# Patient Record
Sex: Female | Born: 1985 | Race: White | Hispanic: No | Marital: Single | Smoking: Current some day smoker
Health system: Southern US, Community
[De-identification: ages and names within clinical notes are randomized; demographics above are authoritative.]

## PROBLEM LIST (undated history)

## (undated) DIAGNOSIS — R519 Headache, unspecified: Secondary | ICD-10-CM

## (undated) DIAGNOSIS — R51 Headache: Secondary | ICD-10-CM

## (undated) DIAGNOSIS — B89 Unspecified parasitic disease: Secondary | ICD-10-CM

## (undated) DIAGNOSIS — IMO0001 Reserved for inherently not codable concepts without codable children: Secondary | ICD-10-CM

## (undated) DIAGNOSIS — F209 Schizophrenia, unspecified: Secondary | ICD-10-CM

## (undated) DIAGNOSIS — R569 Unspecified convulsions: Secondary | ICD-10-CM

## (undated) DIAGNOSIS — F32A Depression, unspecified: Secondary | ICD-10-CM

## (undated) DIAGNOSIS — F419 Anxiety disorder, unspecified: Secondary | ICD-10-CM

## (undated) DIAGNOSIS — R634 Abnormal weight loss: Secondary | ICD-10-CM

## (undated) DIAGNOSIS — R161 Splenomegaly, not elsewhere classified: Principal | ICD-10-CM

## (undated) DIAGNOSIS — D61818 Other pancytopenia: Principal | ICD-10-CM

## (undated) DIAGNOSIS — F4310 Post-traumatic stress disorder, unspecified: Secondary | ICD-10-CM

## (undated) DIAGNOSIS — D649 Anemia, unspecified: Secondary | ICD-10-CM

## (undated) DIAGNOSIS — F22 Delusional disorders: Secondary | ICD-10-CM

## (undated) DIAGNOSIS — T148XXA Other injury of unspecified body region, initial encounter: Secondary | ICD-10-CM

## (undated) DIAGNOSIS — F845 Asperger's syndrome: Secondary | ICD-10-CM

## (undated) DIAGNOSIS — R2689 Other abnormalities of gait and mobility: Secondary | ICD-10-CM

## (undated) DIAGNOSIS — F329 Major depressive disorder, single episode, unspecified: Secondary | ICD-10-CM

## (undated) HISTORY — DX: Other pancytopenia: D61.818

## (undated) HISTORY — DX: Abnormal weight loss: R63.4

## (undated) HISTORY — PX: NO PAST SURGERIES: SHX2092

## (undated) HISTORY — DX: Splenomegaly, not elsewhere classified: R16.1

## (undated) HISTORY — DX: Anemia, unspecified: D64.9

## (undated) HISTORY — PX: DILATION AND CURETTAGE OF UTERUS: SHX78

## (undated) HISTORY — DX: Other injury of unspecified body region, initial encounter: T14.8XXA

---
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
## 2010-04-19 ENCOUNTER — Emergency Department (HOSPITAL_COMMUNITY)
Admission: EM | Admit: 2010-04-19 | Discharge: 2010-04-20 | Disposition: A | Discharge status: Home / Self Care | Payer: BC Managed Care – PPO | Attending: Emergency Medicine | Admitting: Emergency Medicine

## 2010-04-19 ENCOUNTER — Emergency Department (HOSPITAL_COMMUNITY): Payer: BC Managed Care – PPO

## 2010-04-19 DIAGNOSIS — F848 Other pervasive developmental disorders: Secondary | ICD-10-CM | POA: Insufficient documentation

## 2010-04-19 DIAGNOSIS — B80 Enterobiasis: Secondary | ICD-10-CM | POA: Insufficient documentation

## 2010-04-19 DIAGNOSIS — R630 Anorexia: Secondary | ICD-10-CM | POA: Insufficient documentation

## 2010-04-19 DIAGNOSIS — K644 Residual hemorrhoidal skin tags: Secondary | ICD-10-CM | POA: Insufficient documentation

## 2010-04-19 DIAGNOSIS — K625 Hemorrhage of anus and rectum: Secondary | ICD-10-CM | POA: Insufficient documentation

## 2010-04-19 LAB — URINALYSIS, ROUTINE W REFLEX MICROSCOPIC
Bilirubin Urine: NEGATIVE
Hgb urine dipstick: NEGATIVE
Ketones, ur: NEGATIVE mg/dL
Nitrite: NEGATIVE
Protein, ur: NEGATIVE mg/dL
Specific Gravity, Urine: 1.024 (ref 1.005–1.030)
Urobilinogen, UA: 1 mg/dL (ref 0.0–1.0)

## 2010-04-19 LAB — DIFFERENTIAL
Basophils Absolute: 0 10*3/uL (ref 0.0–0.1)
Eosinophils Relative: 1 % (ref 0–5)
Lymphocytes Relative: 22 % (ref 12–46)
Lymphs Abs: 1.3 10*3/uL (ref 0.7–4.0)
Monocytes Relative: 9 % (ref 3–12)
Neutro Abs: 4.1 10*3/uL (ref 1.7–7.7)

## 2010-04-19 LAB — POCT PREGNANCY, URINE: Preg Test, Ur: NEGATIVE

## 2010-04-19 LAB — CBC
HCT: 32.1 % — ABNORMAL LOW (ref 36.0–46.0)
Hemoglobin: 9.7 g/dL — ABNORMAL LOW (ref 12.0–15.0)
MCHC: 30.2 g/dL (ref 30.0–36.0)
MCV: 73.8 fL — ABNORMAL LOW (ref 78.0–100.0)
RDW: 17.5 % — ABNORMAL HIGH (ref 11.5–15.5)
WBC: 6 10*3/uL (ref 4.0–10.5)

## 2010-04-19 LAB — COMPREHENSIVE METABOLIC PANEL
ALT: 21 U/L (ref 0–35)
Alkaline Phosphatase: 85 U/L (ref 39–117)
BUN: 12 mg/dL (ref 6–23)
CO2: 29 mEq/L (ref 19–32)
GFR calc non Af Amer: >60 mL/min (ref >60)
Glucose, Bld: 97 mg/dL (ref 70–99)
Potassium: 3.5 mEq/L (ref 3.5–5.1)
Sodium: 139 mEq/L (ref 135–145)
Total Bilirubin: 0.6 mg/dL (ref 0.3–1.2)
Total Protein: 7.5 g/dL (ref 6.0–8.3)

## 2010-04-19 LAB — LIPASE, BLOOD: Lipase: 43 U/L (ref 11–59)

## 2013-07-17 ENCOUNTER — Encounter (HOSPITAL_COMMUNITY): Payer: Self-pay | Admitting: Emergency Medicine

## 2013-07-17 ENCOUNTER — Emergency Department (HOSPITAL_COMMUNITY)
Admission: EM | Admit: 2013-07-17 | Discharge: 2013-07-17 | Disposition: A | Discharge status: Home / Self Care | Payer: Medicaid Other | Attending: Emergency Medicine | Admitting: Emergency Medicine

## 2013-07-17 DIAGNOSIS — Z8659 Personal history of other mental and behavioral disorders: Secondary | ICD-10-CM | POA: Insufficient documentation

## 2013-07-17 DIAGNOSIS — F17200 Nicotine dependence, unspecified, uncomplicated: Secondary | ICD-10-CM | POA: Insufficient documentation

## 2013-07-17 DIAGNOSIS — R791 Abnormal coagulation profile: Secondary | ICD-10-CM | POA: Insufficient documentation

## 2013-07-17 DIAGNOSIS — D696 Thrombocytopenia, unspecified: Secondary | ICD-10-CM | POA: Insufficient documentation

## 2013-07-17 DIAGNOSIS — R5381 Other malaise: Secondary | ICD-10-CM | POA: Insufficient documentation

## 2013-07-17 DIAGNOSIS — Z880 Allergy status to penicillin: Secondary | ICD-10-CM | POA: Insufficient documentation

## 2013-07-17 DIAGNOSIS — R5383 Other fatigue: Secondary | ICD-10-CM

## 2013-07-17 DIAGNOSIS — D649 Anemia, unspecified: Secondary | ICD-10-CM | POA: Insufficient documentation

## 2013-07-17 HISTORY — DX: Anxiety disorder, unspecified: F41.9

## 2013-07-17 HISTORY — DX: Asperger's syndrome: F84.5

## 2013-07-17 HISTORY — DX: Post-traumatic stress disorder, unspecified: F43.10

## 2013-07-17 LAB — IRON AND TIBC
Iron: 41 ug/dL — ABNORMAL LOW (ref 42–135)
SATURATION RATIOS: 11 % — AB (ref 20–55)
TIBC: 389 ug/dL (ref 250–470)
UIBC: 348 ug/dL (ref 125–400)

## 2013-07-17 LAB — COMPREHENSIVE METABOLIC PANEL
ALBUMIN: 5 g/dL (ref 3.5–5.2)
ALK PHOS: 58 U/L (ref 39–117)
ALT: 29 U/L (ref 0–35)
AST: 36 U/L (ref 0–37)
BILIRUBIN TOTAL: 0.4 mg/dL (ref 0.3–1.2)
BUN: 8 mg/dL (ref 6–23)
CHLORIDE: 101 meq/L (ref 96–112)
CO2: 26 mEq/L (ref 19–32)
Calcium: 10.3 mg/dL (ref 8.4–10.5)
Creatinine, Ser: 0.64 mg/dL (ref 0.50–1.10)
GFR calc Af Amer: >90 mL/min (ref >90)
GFR calc non Af Amer: >90 mL/min (ref >90)
GLUCOSE: 83 mg/dL (ref 70–99)
POTASSIUM: 4.6 meq/L (ref 3.7–5.3)
Sodium: 141 mEq/L (ref 137–147)
Total Protein: 7.8 g/dL (ref 6.0–8.3)

## 2013-07-17 LAB — CBC WITH DIFFERENTIAL/PLATELET
BASOS PCT: 1 % (ref 0–1)
Basophils Absolute: 0 10*3/uL (ref 0.0–0.1)
Eosinophils Absolute: 0 10*3/uL (ref 0.0–0.7)
Eosinophils Relative: 1 % (ref 0–5)
HCT: 31.4 % — ABNORMAL LOW (ref 36.0–46.0)
HEMOGLOBIN: 10.4 g/dL — AB (ref 12.0–15.0)
LYMPHS ABS: 1.3 10*3/uL (ref 0.7–4.0)
Lymphocytes Relative: 37 % (ref 12–46)
MCH: 29.5 pg (ref 26.0–34.0)
MCHC: 33.1 g/dL (ref 30.0–36.0)
MCV: 89.2 fL (ref 78.0–100.0)
MONOS PCT: 5 % (ref 3–12)
Monocytes Absolute: 0.2 10*3/uL (ref 0.1–1.0)
NEUTROS ABS: 2 10*3/uL (ref 1.7–7.7)
NEUTROS PCT: 57 % (ref 43–77)
Platelets: 78 10*3/uL — ABNORMAL LOW (ref 150–400)
RBC: 3.52 MIL/uL — AB (ref 3.87–5.11)
RDW: 14.3 % (ref 11.5–15.5)
WBC: 3.4 10*3/uL — ABNORMAL LOW (ref 4.0–10.5)

## 2013-07-17 LAB — PROTIME-INR
INR: 1.98 — ABNORMAL HIGH (ref 0.00–1.49)
Prothrombin Time: 21.9 seconds — ABNORMAL HIGH (ref 11.6–15.2)

## 2013-07-17 LAB — RETICULOCYTES
RBC.: 3.54 MIL/uL — AB (ref 3.87–5.11)
Retic Count, Absolute: 46 10*3/uL (ref 19.0–186.0)
Retic Ct Pct: 1.3 % (ref 0.4–3.1)

## 2013-07-17 LAB — POC OCCULT BLOOD, ED: FECAL OCCULT BLD: NEGATIVE

## 2013-07-17 LAB — FERRITIN: Ferritin: 45 ng/mL (ref 10–291)

## 2013-07-17 LAB — FOLATE

## 2013-07-17 LAB — VITAMIN B12: Vitamin B-12: 635 pg/mL (ref 211–911)

## 2013-07-17 NOTE — Discharge Instructions (Signed)
Iron Deficiency Anemia, Adult Anemia is when you have a low number of healthy red blood cells. It is often caused by too little iron. This is called iron deficiency anemia. It may make you tired and short of breath. HOME CARE   Take iron as told by your doctor.  Take vitamins as told by your doctor.  Eat foods that have iron in them. This includes liver, lean beef, whole-grain bread, eggs, dried fruit, and dark green leafy vegetables. GET HELP RIGHT AWAY IF:  You pass out (faint).  You have chest pain.  You feel sick to your stomach (nauseous) or throw up (vomit).  You get very short of breath with activity.  You are weak.  You have a fast heartbeat.  You start to sweat for no reason.  You become lightheaded when getting up from a chair or bed. MAKE SURE YOU:  Understand these instructions.  Will watch your condition.  Will get help right away if you are not doing well or get worse. Document Released: 03/23/2010 Document Revised: 12/09/2012 Document Reviewed: 10/26/2012 [facility] Patient Information 2014 [place].  Thrombocytopenia Thrombocytopenia is a condition in which there is an abnormally small number of platelets in your blood. Platelets are also called thrombocytes. Platelets are needed for blood clotting. CAUSES Thrombocytopenia is caused by:   Decreased production of platelets. This can be caused by:  Aplastic anemia in which your bone marrow quits making blood cells.  Cancer in the bone marrow.  Use of certain medicines, including chemotherapy.  Infection in the bone marrow.  Heavy alcohol consumption.  Increased destruction of platelets. This can be caused by:  Certain immune diseases.  Use of certain drugs.  Certain blood clotting disorders.  Certain inherited disorders.  Certain bleeding disorders.  Pregnancy.  Having an enlarged spleen (hypersplenism). In hypersplenism, the spleen gathers up platelets from circulation. This means  the platelets are not available to help with blood clotting. The spleen can enlarge due to cirrhosis or other conditions. SYMPTOMS  The symptoms of thrombocytopenia are side effects of poor blood clotting. Some of these are:  Abnormal bleeding.  Nosebleeds.  Heavy menstrual periods.  Blood in the urine or stools.  Purpura. This is a purplish discoloration in the skin produced by small bleeding vessels near the surface of the skin.  Bruising.  A rash that may be petechial. This looks like pinpoint, purplish-red spots on the skin and mucous membranes. It is caused by bleeding from small blood vessels (capillaries). DIAGNOSIS  Your caregiver will make this diagnosis based on your exam and blood tests. Sometimes, a bone marrow study is done to look for the original cells (megakaryocytes) that make platelets. TREATMENT  Treatment depends on the cause of the condition.  Medicines may be given to help protect your platelets from being destroyed.  In some cases, a replacement (transfusion) of platelets may be required to stop or prevent bleeding.  Sometimes, the spleen must be surgically removed. HOME CARE INSTRUCTIONS   Check the skin and linings inside your mouth for bruising or bleeding as directed by your caregiver.  Check your sputum, urine, and stool for blood as directed by your caregiver.  Do not return to any activities that could cause bumps or bruises until your caregiver says it is okay.  Take extra care not to cut yourself when shaving or when using scissors, needles, knives, and other tools.  Take extra care not to burn yourself when ironing or cooking.  Ask your caregiver if  it is okay for you to drink alcohol.  Only take over-the-counter or prescription medicines as directed by your caregiver.  Notify all your caregivers, including dentists and eye doctors, about your condition. SEEK IMMEDIATE MEDICAL CARE IF:   You develop active bleeding from anywhere in your  body.  You develop unexplained bruising or bleeding.  You have blood in your sputum, urine, or stool. MAKE SURE YOU:  Understand these instructions.  Will watch your condition.  Will get help right away if you are not doing well or get worse. Document Released: 02/18/2005 Document Revised: 05/13/2011 Document Reviewed: 12/21/2010 [facility] Patient Information 2014 [place], [place].

## 2013-07-17 NOTE — ED Provider Notes (Signed)
CSN: [id number]     Arrival date & time 07/17/13  1024 History   First MD Initiated Contact with Patient 07/17/13 1037     Chief Complaint  Patient presents with  . Weakness  . dark stools     Patient is a 28 y.o. female presenting with weakness. The history is provided by the patient.  Weakness   Pt has noticed blood in her stool for over the last month.  Over a month and a half ago she was told that her platelet count and her red blood cell count were low and a 6 month follow up was recommended.  Pt has been having increasing weakness and fatigue.  No vomiting.  She has noticed easy bruising.  She denies any trauma. She has looked paler as well.   She feels like her exercise tolerance has decreased as well. Past Medical History  Diagnosis Date  . PTSD (post-traumatic stress disorder)   . Anxiety   . Asperger's disorder    History reviewed. No pertinent past surgical history. No family history on file. History  Substance Use Topics  . Smoking status: Current Some Day Smoker    Types: Cigarettes  . Smokeless tobacco: Not on file  . Alcohol Use: No   OB History   Grav Para Term Preterm Abortions TAB SAB Ect Mult Living                 Review of Systems  Neurological: Positive for weakness.  All other systems reviewed and are negative.     Allergies  Ceclor; Peanut-containing drug products; and Penicillins  Home Medications   Prior to Admission medications   Not on File   BP 124/87  Pulse 67  Temp(Src) 97.9 F (36.6 C) (Oral)  Resp 17  SpO2 100% Physical Exam  Nursing note and vitals reviewed. Constitutional: She appears well-developed and well-nourished. No distress.  HENT:  Head: Normocephalic and atraumatic.  Right Ear: External ear normal.  Left Ear: External ear normal.  Mouth/Throat: No oropharyngeal exudate.  Eyes: Conjunctivae are normal. Right eye exhibits no discharge. Left eye exhibits no discharge. No scleral icterus.  Neck: Neck supple. No  tracheal deviation present.  Cardiovascular: Normal rate, regular rhythm and intact distal pulses.   Pulmonary/Chest: Effort normal and breath sounds normal. No stridor. No respiratory distress. She has no wheezes. She has no rales.  Abdominal: Soft. Bowel sounds are normal. She exhibits no distension. There is no tenderness. There is no rebound and no guarding.  Musculoskeletal: She exhibits no edema and no tenderness.  Neurological: She is alert. She has normal strength. No cranial nerve deficit (no facial droop, extraocular movements intact, no slurred speech) or sensory deficit. She exhibits normal muscle tone. She displays no seizure activity. Coordination normal.  Skin: Skin is warm and dry. No rash noted. She is not diaphoretic.  Multiple old bruises (brown in color) on extremities, none on torso, no petechiae or purpura  Psychiatric: She has a normal mood and affect.    ED Course  Procedures (including critical care time) Labs Review Labs Reviewed  CBC WITH DIFFERENTIAL - Abnormal; Notable for the following:    WBC 3.4 (*)    RBC 3.52 (*)    Hemoglobin 10.4 (*)    HCT 31.4 (*)    Platelets 78 (*)    All other components within normal limits  PROTIME-INR - Abnormal; Notable for the following:    Prothrombin Time 21.9 (*)    INR 1.98 (*)  All other components within normal limits  COMPREHENSIVE METABOLIC PANEL  VITAMIN S50  FOLATE  IRON AND TIBC  FERRITIN  RETICULOCYTES  POC OCCULT BLOOD, ED     MDM   Final diagnoses:  Thrombocytopenia  Anemia    Patient has no evidence of blood in her stool to suggest active bleeding.  Patient is anemic. Compared to lab results in 2012 this appears to be stable. The thrombocytopenia and leukopenia are new. Patient also has an elevated PT-INR with normal LFTs.   Patient denies taking any medications or supplements. She has not had any recent illnesses or fevers.  I doubt TTP.  ?ITP, myelodysplastic syndrome, von  willebrands  Patient is otherwise hemodynamically stable without evidence of active bleeding. I think she can safely followup with hematologist for further evaluation.  Discussed these findings with the patient and her husband. They agree and are comfortable with this plan    Kathalene Frames, MD 07/17/13 1306

## 2013-07-17 NOTE — ED Notes (Addendum)
Pt from home via [facility] c/o weakness and dark stools x 3 weeks. HX of anemia. Per husband pt has aspergers anxiety, and PTSD and he answers her questions for her. Pt has multiple bruises on arms and legs. She and her husband report that these bruises come from her carrying things and bumping into things.

## 2013-09-22 ENCOUNTER — Telehealth: Payer: Self-pay | Admitting: Hematology and Oncology

## 2013-09-22 NOTE — Telephone Encounter (Signed)
LEFT MESSAGE FOR PATIENT AND GAVE NP APPT FOR 07/29 @ 11:45 W/DR. [place].  REFERRING DR. Vista Lawman DX-

## 2013-09-29 ENCOUNTER — Encounter: Payer: Self-pay | Admitting: Hematology and Oncology

## 2013-09-29 ENCOUNTER — Ambulatory Visit: Payer: Medicaid Other

## 2013-09-29 ENCOUNTER — Ambulatory Visit (HOSPITAL_BASED_OUTPATIENT_CLINIC_OR_DEPARTMENT_OTHER): Payer: Medicaid Other | Admitting: Hematology and Oncology

## 2013-09-29 ENCOUNTER — Encounter (INDEPENDENT_AMBULATORY_CARE_PROVIDER_SITE_OTHER): Payer: Self-pay

## 2013-09-29 ENCOUNTER — Telehealth: Payer: Self-pay | Admitting: Hematology and Oncology

## 2013-09-29 ENCOUNTER — Other Ambulatory Visit: Payer: Medicaid Other

## 2013-09-29 VITALS — BP 119/89 | HR 90 | Temp 97.8°F | Resp 20 | Ht 69.0 in | Wt 121.9 lb

## 2013-09-29 DIAGNOSIS — F329 Major depressive disorder, single episode, unspecified: Secondary | ICD-10-CM

## 2013-09-29 DIAGNOSIS — F32A Depression, unspecified: Secondary | ICD-10-CM

## 2013-09-29 DIAGNOSIS — R634 Abnormal weight loss: Secondary | ICD-10-CM

## 2013-09-29 DIAGNOSIS — F4310 Post-traumatic stress disorder, unspecified: Secondary | ICD-10-CM

## 2013-09-29 DIAGNOSIS — T148XXA Other injury of unspecified body region, initial encounter: Secondary | ICD-10-CM

## 2013-09-29 DIAGNOSIS — D61818 Other pancytopenia: Secondary | ICD-10-CM

## 2013-09-29 DIAGNOSIS — F3289 Other specified depressive episodes: Secondary | ICD-10-CM

## 2013-09-29 DIAGNOSIS — F17200 Nicotine dependence, unspecified, uncomplicated: Secondary | ICD-10-CM

## 2013-09-29 DIAGNOSIS — Z720 Tobacco use: Secondary | ICD-10-CM

## 2013-09-29 HISTORY — DX: Abnormal weight loss: R63.4

## 2013-09-29 HISTORY — DX: Other pancytopenia: D61.818

## 2013-09-29 HISTORY — DX: Other injury of unspecified body region, initial encounter: T14.8XXA

## 2013-09-29 LAB — CBC & DIFF AND RETIC
BASO%: 0.2 % (ref 0.0–2.0)
Basophils Absolute: 0 10*3/uL (ref 0.0–0.1)
EOS%: 0.2 % (ref 0.0–7.0)
Eosinophils Absolute: 0 10*3/uL (ref 0.0–0.5)
HEMATOCRIT: 30.3 % — AB (ref 34.8–46.6)
HGB: 10.4 g/dL — ABNORMAL LOW (ref 11.6–15.9)
IMMATURE RETIC FRACT: 2.8 % (ref 1.60–10.00)
LYMPH#: 1.9 10*3/uL (ref 0.9–3.3)
LYMPH%: 40.4 % (ref 14.0–49.7)
MCH: 29.1 pg (ref 25.1–34.0)
MCHC: 34.3 g/dL (ref 31.5–36.0)
MCV: 84.6 fL (ref 79.5–101.0)
MONO#: 0.2 10*3/uL (ref 0.1–0.9)
MONO%: 3.8 % (ref 0.0–14.0)
NEUT#: 2.6 10*3/uL (ref 1.5–6.5)
NEUT%: 55.4 % (ref 38.4–76.8)
Platelets: 133 10*3/uL — ABNORMAL LOW (ref 145–400)
RBC: 3.58 10*6/uL — ABNORMAL LOW (ref 3.70–5.45)
RDW: 13.8 % (ref 11.2–14.5)
RETIC CT ABS: 35.8 10*3/uL (ref 33.70–90.70)
Retic %: 1 % (ref 0.70–2.10)
WBC: 4.7 10*3/uL (ref 3.9–10.3)
nRBC: 0 % (ref 0–0)

## 2013-09-29 LAB — COMPREHENSIVE METABOLIC PANEL (CC13)
ALT: 27 U/L (ref 0–55)
AST: 35 U/L — AB (ref 5–34)
Albumin: 5.2 g/dL — ABNORMAL HIGH (ref 3.5–5.0)
Alkaline Phosphatase: 52 U/L (ref 40–150)
Anion Gap: 10 mEq/L (ref 3–11)
BILIRUBIN TOTAL: 0.51 mg/dL (ref 0.20–1.20)
BUN: 13.2 mg/dL (ref 7.0–26.0)
CALCIUM: 10 mg/dL (ref 8.4–10.4)
CHLORIDE: 95 meq/L — AB (ref 98–109)
CO2: 25 mEq/L (ref 22–29)
CREATININE: 0.8 mg/dL (ref 0.6–1.1)
Glucose: 88 mg/dl (ref 70–140)
Potassium: 4 mEq/L (ref 3.5–5.1)
SODIUM: 129 meq/L — AB (ref 136–145)
TOTAL PROTEIN: 8.2 g/dL (ref 6.4–8.3)

## 2013-09-29 LAB — MORPHOLOGY: PLT EST: DECREASED

## 2013-09-29 LAB — IRON AND TIBC CHCC
%SAT: 10 % — AB (ref 21–57)
Iron: 43 ug/dL (ref 41–142)
TIBC: 426 ug/dL (ref 236–444)
UIBC: 383 ug/dL (ref 120–384)

## 2013-09-29 LAB — CHCC SMEAR

## 2013-09-29 LAB — TSH CHCC: TSH: 1.682 m[IU]/L (ref 0.308–3.960)

## 2013-09-29 LAB — FERRITIN CHCC: Ferritin: 74 ng/ml (ref 9–269)

## 2013-09-29 LAB — PROTHROMBIN TIME
INR: 1.72 — AB (ref <1.50)
Prothrombin Time: 20.2 seconds — ABNORMAL HIGH (ref 11.6–15.2)

## 2013-09-29 LAB — HOLD TUBE, BLOOD BANK

## 2013-09-29 NOTE — Assessment & Plan Note (Signed)
I spent some time counseling the patient the importance of tobacco cessation. she is currently attempting to quit on her own 

## 2013-09-29 NOTE — Telephone Encounter (Signed)
Pt confirmed labs/ov per 07/29 POF, gave pt AVS...KJ °

## 2013-09-29 NOTE — Assessment & Plan Note (Signed)
She had weight loss due to poor appetite and anorexia. I will order thyroid function tests for further evaluation.

## 2013-09-29 NOTE — Assessment & Plan Note (Signed)
Stable. She has good social support.

## 2013-09-29 NOTE — Progress Notes (Signed)
Checked in new patient with no financial issues prior to seeing the dr. She has appt card °

## 2013-09-29 NOTE — Assessment & Plan Note (Signed)
She has significant excessive bruising. I will order additional workup for bleeding disorder.

## 2013-09-29 NOTE — Assessment & Plan Note (Signed)
She is on medication for this. She denies suicidal ideation.

## 2013-09-29 NOTE — Progress Notes (Signed)
[facility] CONSULT NOTE  Patient has no care team.  CHIEF COMPLAINTS/PURPOSE OF CONSULTATION:  Abnormal blood work.  HISTORY OF PRESENTING ILLNESS:  Mary Cooley 28 y.o. female is here because of pancytopenia.  She was found to have abnormal CBC from routine blood work monitoring by her primary care provider in May of 2015. At that time, her white blood cell count was 3.4, hemoglobin 10.4, platelet count 78,000 with elevated INR. This patient had prior history of posttraumatic stress disorder, depression, and anorexia nervosa. She had weight loss recently. She complained of intermittent bleeding from her stool. She also has extensive bruising throughout. She denies recent epistaxis, hematuria, or heavy menstruation. In fact, she has not had a period for a long time. The patient denies history of liver disease, exposure to heparin, history of cardiac murmur/prior cardiovascular surgery or recent new medications She denies prior blood or platelet transfusions   MEDICAL HISTORY:  Past Medical History  Diagnosis Date  . PTSD (post-traumatic stress disorder)   . Anxiety   . Asperger's disorder   . Anemia   . Other pancytopenia 09/29/2013  . Bruising 09/29/2013  . Weight loss 09/29/2013    SURGICAL HISTORY: History reviewed. No pertinent past surgical history.  SOCIAL HISTORY: History   Social History  . Marital Status: Single    Spouse Name: N/A    Number of Children: N/A  . Years of Education: N/A   Occupational History  . Not on file.   Social History Main Topics  . Smoking status: Current Some Day Smoker -- 0.01 packs/day    Types: Cigarettes  . Smokeless tobacco: Never Used  . Alcohol Use: No  . Drug Use: No  . Sexual Activity: Not on file   Other Topics Concern  . Not on file   Social History Narrative  . No narrative on file    FAMILY HISTORY: Family History  Problem Relation Age of Onset  . Heart disease Father     ALLERGIES:  is  allergic to ceclor; peanut-containing drug products; and penicillins.  MEDICATIONS:  Current Outpatient Prescriptions  Medication Sig Dispense Refill  . clonazePAM (KLONOPIN) 0.5 MG tablet Take 0.5 mg by mouth 3 (three) times daily as needed for anxiety.      Marland Kitchen ibuprofen (ADVIL,MOTRIN) 200 MG tablet Take 200-400 mg by mouth every 6 (six) hours as needed for moderate pain.      . Multiple Vitamin (MULTIVITAMIN WITH MINERALS) TABS tablet Take 1 tablet by mouth daily.      . promethazine (PHENERGAN) 12.5 MG tablet Take 12.5-25 mg by mouth every 6 (six) hours as needed for nausea or vomiting.      . sertraline (ZOLOFT) 100 MG tablet Take 100 mg by mouth daily.       No current facility-administered medications for this visit.    REVIEW OF SYSTEMS:   Constitutional: Denies fevers, chills or abnormal night sweats Eyes: Denies blurriness of vision, double vision or watery eyes Ears, nose, mouth, throat, and face: Denies mucositis or sore throat Respiratory: Denies cough, dyspnea or wheezes Cardiovascular: Denies palpitation, chest discomfort or lower extremity swelling Gastrointestinal:  Denies nausea, heartburn or change in bowel habits Skin: Denies abnormal skin rashes Lymphatics: Denies new lymphadenopathy  Neurological:Denies numbness, tingling or new weaknesses Behavioral/Psych: Mood is stable, no new changes  All other systems were reviewed with the patient and are negative.  PHYSICAL EXAMINATION: ECOG PERFORMANCE STATUS: 0 - Asymptomatic  Filed Vitals:   09/29/13 1218  BP:  119/89  Pulse: 90  Temp: 97.8 F (36.6 C)  Resp: 20   Filed Weights   09/29/13 1218  Weight: 121 lb 14.4 oz (55.293 kg)    GENERAL:alert, no distress and comfortable. She looks thin and unkempt SKIN: Extensive bruises all over her body. No petechiae rash. Notice skin vitiligo EYES: normal, conjunctiva are pink and non-injected, sclera clear OROPHARYNX:no exudate, no erythema and lips, buccal mucosa,  and tongue normal. Poor dentition is noted NECK: supple, thyroid normal size, non-tender, without nodularity LYMPH:  no palpable lymphadenopathy in the cervical, axillary or inguinal LUNGS: clear to auscultation and percussion with normal breathing effort HEART: regular rate & rhythm and no murmurs and no lower extremity edema ABDOMEN:abdomen soft, non-tender and normal bowel sounds. Mildly enlarged spleen is palpable Musculoskeletal:no cyanosis of digits and no clubbing  PSYCH: alert & oriented x 3 with fluent speech NEURO: no focal motor/sensory deficits  LABORATORY DATA:  I have reviewed the data as listed Recent Results (from the past 2160 hour(s))  POC OCCULT BLOOD, ED     Status: None   Collection Time    07/17/13 11:03 AM      Result Value Ref Range   Fecal Occult Bld NEGATIVE  NEGATIVE  CBC WITH DIFFERENTIAL     Status: Abnormal   Collection Time    07/17/13 11:30 AM      Result Value Ref Range   WBC 3.4 (*) 4.0 - 10.5 K/uL   RBC 3.52 (*) 3.87 - 5.11 MIL/uL   Hemoglobin 10.4 (*) 12.0 - 15.0 g/dL   HCT 31.4 (*) 36.0 - 46.0 %   MCV 89.2  78.0 - 100.0 fL   MCH 29.5  26.0 - 34.0 pg   MCHC 33.1  30.0 - 36.0 g/dL   RDW 14.3  11.5 - 15.5 %   Platelets 78 (*) 150 - 400 K/uL   Comment: REPEATED TO VERIFY     SPECIMEN CHECKED FOR CLOTS     PLATELET COUNT CONFIRMED BY SMEAR   Neutrophils Relative % 57  43 - 77 %   Neutro Abs 2.0  1.7 - 7.7 K/uL   Lymphocytes Relative 37  12 - 46 %   Lymphs Abs 1.3  0.7 - 4.0 K/uL   Monocytes Relative 5  3 - 12 %   Monocytes Absolute 0.2  0.1 - 1.0 K/uL   Eosinophils Relative 1  0 - 5 %   Eosinophils Absolute 0.0  0.0 - 0.7 K/uL   Basophils Relative 1  0 - 1 %   Basophils Absolute 0.0  0.0 - 0.1 K/uL  COMPREHENSIVE METABOLIC PANEL     Status: None   Collection Time    07/17/13 11:30 AM      Result Value Ref Range   Sodium 141  137 - 147 mEq/L   Potassium 4.6  3.7 - 5.3 mEq/L   Chloride 101  96 - 112 mEq/L   CO2 26  19 - 32 mEq/L    Glucose, Bld 83  70 - 99 mg/dL   BUN 8  6 - 23 mg/dL   Creatinine, Ser 0.64  0.50 - 1.10 mg/dL   Calcium 10.3  8.4 - 10.5 mg/dL   Total Protein 7.8  6.0 - 8.3 g/dL   Albumin 5.0  3.5 - 5.2 g/dL   AST 36  0 - 37 U/L   ALT 29  0 - 35 U/L   Alkaline Phosphatase 58  39 - 117 U/L   Total  Bilirubin 0.4  0.3 - 1.2 mg/dL   GFR calc non Af Amer >90  >90 mL/min   GFR calc Af Amer >90  >90 mL/min   Comment: (NOTE)     The eGFR has been calculated using the CKD EPI equation.     This calculation has not been validated in all clinical situations.     eGFR's persistently <90 mL/min signify possible Chronic Kidney     Disease.  VITAMIN B12     Status: None   Collection Time    07/17/13 11:30 AM      Result Value Ref Range   Vitamin B-12 635  211 - 911 pg/mL   Comment: Performed at [facility]     Status: None   Collection Time    07/17/13 11:30 AM      Result Value Ref Range   Folate >20.0     Comment: (NOTE)     Reference Ranges            Deficient:       0.4 - 3.3 ng/mL            Indeterminate:   3.4 - 5.4 ng/mL            Normal:              > 5.4 ng/mL     Performed at [facility]  IRON AND TIBC     Status: Abnormal   Collection Time    07/17/13 11:30 AM      Result Value Ref Range   Iron 41 (*) 42 - 135 ug/dL   TIBC 389  250 - 470 ug/dL   Saturation Ratios 11 (*) 20 - 55 %   UIBC 348  125 - 400 ug/dL   Comment: Performed at [facility]     Status: None   Collection Time    07/17/13 11:30 AM      Result Value Ref Range   Ferritin 45  10 - 291 ng/mL   Comment: Performed at [facility]     Status: Abnormal   Collection Time    07/17/13 11:30 AM      Result Value Ref Range   Retic Ct Pct 1.3  0.4 - 3.1 %   RBC. 3.54 (*) 3.87 - 5.11 MIL/uL   Retic Count, Manual 46.0  19.0 - 186.0 K/uL  PROTIME-INR     Status: Abnormal   Collection Time    07/17/13 11:50 AM      Result Value Ref Range   Prothrombin  Time 21.9 (*) 11.6 - 15.2 seconds   INR 1.98 (*) 0.00 - 1.49  HIV ANTIBODY (ROUTINE TESTING)     Status: None   Collection Time    09/29/13  1:26 PM      Result Value Ref Range   HIV 1&2 Ab, 4th Generation NONREACTIVE  NONREACTIVE   Comment:  A NONREACTIVE HIV Ag/Ab result does not exclude HIV infection sincethe time frame for seroconversion is variable. If acute HIV infectionis suspected, a HIV-1 RNA Qualitative TMA test is recommended. HIV-1/2 Antibody Diff         Not indicated.HIV-1 RNA,      Qual TMA           Not indicated. PLEASE NOTE: This information has been disclosed to you from [place] confidentiality may be protected by state law. If your staterequires such protection, then the state law  prohibits you from [facility] further      disclosure of the information without the specific writtenconsent of the person to whom it pertains, or as otherwise permittedby law. A general authorization for the release of medical or otherinformation is NOT sufficient for this purpose. The      performance of this assay has not been clinically validated inpatients less than 54 years old.  VITAMIN B12     Status: None   Collection Time    09/29/13  1:26 PM      Result Value Ref Range   Vitamin B-12 580  211 - 911 pg/mL  SEDIMENTATION RATE     Status: None   Collection Time    09/29/13  1:26 PM      Result Value Ref Range   Sed Rate 1  0 - 22 mm/hr  HEPATITIS PANEL, ACUTE     Status: None   Collection Time    09/29/13  1:26 PM      Result Value Ref Range   Hepatitis B Surface Ag NEGATIVE  NEGATIVE   HCV Ab NEGATIVE  NEGATIVE   Hep B C IgM NON REACTIVE  NON REACTIVE   Comment: High levels of Hepatitis B Core IgM antibody are detectableduring the acute stage of Hepatitis B. This antibody is usedto differentiate current from past HBV infection.    Hep A IgM NON REACTIVE  NON REACTIVE  T4, FREE     Status: None   Collection Time    09/29/13  1:26 PM      Result Value Ref Range   Free T4 0.91   0.80 - 1.80 ng/dL  [facility] SMEAR     Status: None   Collection Time    09/29/13  1:27 PM      Result Value Ref Range   Smear Result Smear Available    MORPHOLOGY     Status: None   Collection Time    09/29/13  1:27 PM      Result Value Ref Range   Shistocytes Few  Negative   Burr Cells Few  Negative   Helmet Cell Few  Negative   White Cell Comments C/W auto diff     PLT EST Decreased  Adequate   Platelet Morphology Few Large Plts  Within Normal Limits  FERRITIN [facility]     Status: None   Collection Time    09/29/13  1:27 PM      Result Value Ref Range   Ferritin 74  9 - 269 ng/ml  TSH [facility]     Status: None   Collection Time    09/29/13  1:27 PM      Result Value Ref Range   TSH 1.682  0.308 - 3.960 m(IU)/L  HOLD TUBE, BLOOD BANK     Status: None   Collection Time    09/29/13  1:27 PM      Result Value Ref Range   Hold Tube, Blood Bank Blood Bank Order Cancelled    CBC & DIFF AND RETIC     Status: Abnormal   Collection Time    09/29/13  1:27 PM      Result Value Ref Range   WBC 4.7  3.9 - 10.3 10e3/uL   NEUT# 2.6  1.5 - 6.5 10e3/uL   HGB 10.4 (*) 11.6 - 15.9 g/dL   HCT 30.3 (*) 34.8 - 46.6 %   Platelets 133 (*) 145 - 400 10e3/uL   MCV 84.6  79.5 - 101.0 fL  MCH 29.1  25.1 - 34.0 pg   MCHC 34.3  31.5 - 36.0 g/dL   RBC 3.58 (*) 3.70 - 5.45 10e6/uL   RDW 13.8  11.2 - 14.5 %   lymph# 1.9  0.9 - 3.3 10e3/uL   MONO# 0.2  0.1 - 0.9 10e3/uL   Eosinophils Absolute 0.0  0.0 - 0.5 10e3/uL   Basophils Absolute 0.0  0.0 - 0.1 10e3/uL   NEUT% 55.4  38.4 - 76.8 %   LYMPH% 40.4  14.0 - 49.7 %   MONO% 3.8  0.0 - 14.0 %   EOS% 0.2  0.0 - 7.0 %   BASO% 0.2  0.0 - 2.0 %   nRBC 0  0 - 0 %   Retic % 1.00  0.70 - 2.10 %   Retic Ct Abs 35.80  33.70 - 90.70 10e3/uL   Immature Retic Fract 2.80  1.60 - 10.00 %  IRON AND TIBC [facility]     Status: Abnormal   Collection Time    09/29/13  1:27 PM      Result Value Ref Range   Iron 43  41 - 142 ug/dL   TIBC 426  236 - 444 ug/dL   UIBC 383  120  - 384 ug/dL   %SAT 10 (*) 21 - 57 %  COMPREHENSIVE METABOLIC PANEL (HQ19)     Status: Abnormal   Collection Time    09/29/13  1:27 PM      Result Value Ref Range   Sodium 129 (*) 136 - 145 mEq/L   Potassium 4.0  3.5 - 5.1 mEq/L   Chloride 95 (*) 98 - 109 mEq/L   CO2 25  22 - 29 mEq/L   Glucose 88  70 - 140 mg/dl   BUN 13.2  7.0 - 26.0 mg/dL   Creatinine 0.8  0.6 - 1.1 mg/dL   Total Bilirubin 0.51  0.20 - 1.20 mg/dL   Alkaline Phosphatase 52  40 - 150 U/L   AST 35 (*) 5 - 34 U/L   ALT 27  0 - 55 U/L   Total Protein 8.2  6.4 - 8.3 g/dL   Albumin 5.2 (*) 3.5 - 5.0 g/dL   Calcium 10.0  8.4 - 10.4 mg/dL   Anion Gap 10  3 - 11 mEq/L    ASSESSMENT & PLAN Other pancytopenia The cause is unknown. She could have mineral deficiency or nutritional deficiency due to the severe anorexia. Overall, she is not symptomatic. I will order additional workup for this.  Bruising She has significant excessive bruising. I will order additional workup for bleeding disorder.  Weight loss She had weight loss due to poor appetite and anorexia. I will order thyroid function tests for further evaluation.  PTSD (post-traumatic stress disorder) Stable. She has good social support.  Depression She is on medication for this. She denies suicidal ideation.  Tobacco abuse I spent some time counseling the patient the importance of tobacco cessation. she is currently attempting to quit on her own

## 2013-09-29 NOTE — Assessment & Plan Note (Signed)
The cause is unknown. She could have mineral deficiency or nutritional deficiency due to the severe anorexia. Overall, she is not symptomatic. I will order additional workup for this.

## 2013-09-30 LAB — HEPATITIS PANEL, ACUTE
HCV AB: NEGATIVE
Hep A IgM: NONREACTIVE
Hep B C IgM: NONREACTIVE
Hepatitis B Surface Ag: NEGATIVE

## 2013-09-30 LAB — FIBRINOGEN: Fibrinogen: 60 mg/dL — ABNORMAL LOW (ref 204–475)

## 2013-09-30 LAB — SEDIMENTATION RATE: SED RATE: 1 mm/h (ref 0–22)

## 2013-09-30 LAB — DIRECT ANTIGLOBULIN TEST (NOT AT ARMC)
DAT (COMPLEMENT): NEGATIVE
DAT IgG: NEGATIVE

## 2013-09-30 LAB — HIV ANTIBODY (ROUTINE TESTING W REFLEX): HIV 1&2 Ab, 4th Generation: NONREACTIVE

## 2013-09-30 LAB — APTT: APTT: 41 s — AB (ref 24–37)

## 2013-09-30 LAB — VITAMIN B12: VITAMIN B 12: 580 pg/mL (ref 211–911)

## 2013-09-30 LAB — ANA: Anti Nuclear Antibody(ANA): NEGATIVE

## 2013-09-30 LAB — T4, FREE: Free T4: 0.91 ng/dL (ref 0.80–1.80)

## 2013-10-04 ENCOUNTER — Other Ambulatory Visit: Payer: Self-pay | Admitting: Hematology and Oncology

## 2013-10-04 ENCOUNTER — Other Ambulatory Visit: Payer: Self-pay | Admitting: *Deleted

## 2013-10-04 ENCOUNTER — Telehealth: Payer: Self-pay | Admitting: Hematology and Oncology

## 2013-10-04 ENCOUNTER — Encounter: Payer: Self-pay | Admitting: Hematology and Oncology

## 2013-10-04 ENCOUNTER — Ambulatory Visit (HOSPITAL_BASED_OUTPATIENT_CLINIC_OR_DEPARTMENT_OTHER): Payer: Medicaid Other | Admitting: Hematology and Oncology

## 2013-10-04 ENCOUNTER — Ambulatory Visit: Payer: Medicaid Other

## 2013-10-04 VITALS — BP 119/70 | HR 99 | Temp 98.5°F | Resp 19 | Ht 69.0 in | Wt 121.9 lb

## 2013-10-04 DIAGNOSIS — Z720 Tobacco use: Secondary | ICD-10-CM

## 2013-10-04 DIAGNOSIS — F17200 Nicotine dependence, unspecified, uncomplicated: Secondary | ICD-10-CM

## 2013-10-04 DIAGNOSIS — D61818 Other pancytopenia: Secondary | ICD-10-CM

## 2013-10-04 DIAGNOSIS — R233 Spontaneous ecchymoses: Secondary | ICD-10-CM

## 2013-10-04 DIAGNOSIS — D689 Coagulation defect, unspecified: Secondary | ICD-10-CM

## 2013-10-04 DIAGNOSIS — T148XXA Other injury of unspecified body region, initial encounter: Secondary | ICD-10-CM

## 2013-10-04 DIAGNOSIS — D649 Anemia, unspecified: Secondary | ICD-10-CM

## 2013-10-04 LAB — APTT: APTT: 38 s — AB (ref 24–37)

## 2013-10-04 LAB — PROTHROMBIN TIME
INR: 1.59 — AB (ref <1.50)
Prothrombin Time: 19 seconds — ABNORMAL HIGH (ref 11.6–15.2)

## 2013-10-04 NOTE — Assessment & Plan Note (Signed)
Leukopenia has resolved. Anemia is stable. Her thrombocytopenia is improving. Overall, I suspect this is due to severe malnutrition. I recommend observation for now. Anemia is likely anemia chronic disease.

## 2013-10-04 NOTE — Assessment & Plan Note (Signed)
I spent some time counseling the patient the importance of tobacco cessation. she is currently attempting to quit on her own 

## 2013-10-04 NOTE — Assessment & Plan Note (Signed)
Her bruising is due to coagulopathy. I will order an additional workup for this.

## 2013-10-04 NOTE — Telephone Encounter (Signed)
gv and printed appt scehd and avs for pt for Aug... °

## 2013-10-04 NOTE — Assessment & Plan Note (Addendum)
She has profound abnormal coagulation testing. I will order an additional workup for causes of factor deficiency. Certainly, severely fibrinogen can cause this. She does not have evidence of liver disease.

## 2013-10-04 NOTE — Progress Notes (Signed)
[place] OFFICE PROGRESS NOTE  No primary provider on file. SUMMARY OF HEMATOLOGIC HISTORY:  Mary Cooley was found to have abnormal CBC from routine blood work monitoring by her primary care provider in May of 2015. At that time, her white blood cell count was 3.4, hemoglobin 10.4, platelet count 78,000 with elevated INR. This patient had prior history of posttraumatic stress disorder, depression, and anorexia nervosa. Mary Cooley had weight loss recently. Mary Cooley complained of intermittent bleeding from her stool. Mary Cooley also has extensive bruising throughout. Mary Cooley denies recent epistaxis, hematuria, or heavy menstruation. In fact, Mary Cooley has not had a period for a long time. The patient denies history of liver disease, exposure to heparin, history of cardiac murmur/prior cardiovascular surgery or recent new medications Mary Cooley denies prior blood or platelet transfusions INTERVAL HISTORY: Mary Cooley 28 y.o. female returns for further followup. Mary Cooley continues to bruise easily. Mary Cooley denies recent bleeding.  I have reviewed the past medical history, past surgical history, social history and family history with the patient and they are unchanged from previous note.  ALLERGIES:  is allergic to ceclor; peanut-containing drug products; and penicillins.  MEDICATIONS:  Current Outpatient Prescriptions  Medication Sig Dispense Refill  . clonazePAM (KLONOPIN) 0.5 MG tablet Take 0.5 mg by mouth 3 (three) times daily as needed for anxiety.      Marland Kitchen ibuprofen (ADVIL,MOTRIN) 200 MG tablet Take 200-400 mg by mouth every 6 (six) hours as needed for moderate pain.      . Multiple Vitamin (MULTIVITAMIN WITH MINERALS) TABS tablet Take 1 tablet by mouth daily.      . promethazine (PHENERGAN) 12.5 MG tablet Take 12.5-25 mg by mouth every 6 (six) hours as needed for nausea or vomiting.      . sertraline (ZOLOFT) 100 MG tablet Take 100 mg by mouth daily.       No current facility-administered medications for this visit.      REVIEW OF SYSTEMS:   All other systems were reviewed with the patient and are negative.  PHYSICAL EXAMINATION: ECOG PERFORMANCE STATUS: 0 - Asymptomatic  Filed Vitals:   10/04/13 1314  BP: 119/70  Pulse: 99  Temp: 98.5 F (36.9 C)  Resp: 19   Filed Weights   10/04/13 1314  Weight: 121 lb 14.4 oz (55.293 kg)    GENERAL:alert, no distress and comfortable. Mary Cooley looks unkempt SKIN: Extensive bruises are noted. Musculoskeletal:no cyanosis of digits and no clubbing  NEURO: alert & oriented x 3 with fluent speech, no focal motor/sensory deficits  LABORATORY DATA:  I have reviewed the data as listed No results found for this or any previous visit (from the past 48 hour(s)).  Lab Results  Component Value Date   WBC 4.7 09/29/2013   HGB 10.4* 09/29/2013   HCT 30.3* 09/29/2013   MCV 84.6 09/29/2013   PLT 133* 09/29/2013    ASSESSMENT & PLAN:  Other pancytopenia Leukopenia has resolved. Anemia is stable. Her thrombocytopenia is improving. Overall, I suspect this is due to severe malnutrition. I recommend observation for now. Anemia is likely anemia chronic disease.  Bruising Her bruising is due to coagulopathy. I will order an additional workup for this.  Coagulopathy Mary Cooley has profound abnormal coagulation testing. I will order an additional workup for causes of factor deficiency. Certainly, severely fibrinogen can cause this. Mary Cooley does not have evidence of liver disease.  Tobacco abuse I spent some time counseling the patient the importance of tobacco cessation. Mary Cooley is currently attempting to quit on her own  All questions were answered. The patient knows to call the clinic with any problems, questions or concerns. No barriers to learning was detected.  I spent 15 minutes counseling the patient face to face. The total time spent in the appointment was 20 minutes and more than 50% was on counseling.     [facility], [place], MD 10/04/2013 1:53 PM

## 2013-10-07 LAB — FACTOR 10 ASSAY: FACTOR X ACTIVITY: 110 % (ref 72–134)

## 2013-10-07 LAB — VON WILLEBRAND PANEL
Coagulation Factor VIII: 51 % — ABNORMAL LOW (ref 73–140)
Ristocetin Co-factor, Plasma: 117 % (ref 42–200)
VON WILLEBRAND ANTIGEN, PLASMA: 134 % (ref 50–217)

## 2013-10-07 LAB — FACTOR 5 ASSAY: Factor V Activity: 46 % — ABNORMAL LOW (ref 65–150)

## 2013-10-07 LAB — PTT FACTOR INHIBITOR (MIXING STUDY): PTT: 43 seconds — ABNORMAL HIGH (ref 24–37)

## 2013-10-07 LAB — FIBRINOGEN: FIBRINOGEN: 60 mg/dL — AB (ref 204–475)

## 2013-10-08 LAB — PT FACTOR INHIBITOR (MIXING STUDY): Protime: 22 seconds — ABNORMAL HIGH (ref 11.6–15.2)

## 2013-10-08 LAB — MIXING STUDY DILUTIONS, PT
PATIENT-4/1, IMMEDIATE MIX-PT: 16.9 s
Patient-1/1, Immediate Mix-PT: 14.3 seconds

## 2013-10-13 LAB — MIXING STUDY DILUTIONS, PTT
PATIENT 4/1 IMMEDIATE MIX: 36 s
Patient 1/1 Immediate Mix: 32 seconds

## 2013-10-18 ENCOUNTER — Telehealth: Payer: Self-pay | Admitting: Hematology and Oncology

## 2013-10-18 ENCOUNTER — Encounter: Payer: Self-pay | Admitting: Hematology and Oncology

## 2013-10-18 ENCOUNTER — Ambulatory Visit (HOSPITAL_BASED_OUTPATIENT_CLINIC_OR_DEPARTMENT_OTHER): Payer: Medicaid Other | Admitting: Hematology and Oncology

## 2013-10-18 VITALS — BP 123/82 | HR 78 | Temp 98.2°F | Resp 18 | Ht 69.0 in | Wt 121.7 lb

## 2013-10-18 DIAGNOSIS — D689 Coagulation defect, unspecified: Secondary | ICD-10-CM

## 2013-10-18 DIAGNOSIS — D61818 Other pancytopenia: Secondary | ICD-10-CM

## 2013-10-18 NOTE — Telephone Encounter (Signed)
Pt confirmed labs/ov per 08/17 POF, gave pt AVS.....KJ °

## 2013-10-18 NOTE — Assessment & Plan Note (Signed)
Leukopenia has resolved. Anemia is stable. Her thrombocytopenia is improving. Overall, I suspect this is due to severe malnutrition. I recommend observation for now. Anemia is likely anemia chronic disease. Overall, she is not symptomatic.

## 2013-10-18 NOTE — Progress Notes (Signed)
[place] OFFICE PROGRESS NOTE  No primary provider on file. SUMMARY OF HEMATOLOGIC HISTORY:  She was found to have abnormal CBC from routine blood work monitoring by her primary care provider in May of 2015. At that time, her white blood cell count was 3.4, hemoglobin 10.4, platelet count 78,000 with elevated INR. This patient had prior history of posttraumatic stress disorder, depression, and anorexia nervosa. She had weight loss recently. She complained of intermittent bleeding from her stool. She also has extensive bruising throughout. She denies recent epistaxis, hematuria, or heavy menstruation. In fact, she has not had a period for a long time. The patient denies history of liver disease, exposure to heparin, history of cardiac murmur/prior cardiovascular surgery or recent new medications She denies prior blood or platelet transfusions INTERVAL HISTORY: Mary Cooley 28 y.o. female returns for followup on test results. She has no new complaints.  I have reviewed the past medical history, past surgical history, social history and family history with the patient and they are unchanged from previous note.  ALLERGIES:  is allergic to ceclor; peanut-containing drug products; and penicillins.  MEDICATIONS:  Current Outpatient Prescriptions  Medication Sig Dispense Refill  . clonazePAM (KLONOPIN) 0.5 MG tablet Take 0.5 mg by mouth 3 (three) times daily as needed for anxiety.      Marland Kitchen ibuprofen (ADVIL,MOTRIN) 200 MG tablet Take 200-400 mg by mouth every 6 (six) hours as needed for moderate pain.      . Multiple Vitamin (MULTIVITAMIN WITH MINERALS) TABS tablet Take 1 tablet by mouth daily.      . promethazine (PHENERGAN) 12.5 MG tablet Take 12.5-25 mg by mouth every 6 (six) hours as needed for nausea or vomiting.      . sertraline (ZOLOFT) 100 MG tablet Take 100 mg by mouth daily.       No current facility-administered medications for this visit.     REVIEW OF SYSTEMS:    All other systems were reviewed with the patient and are negative.  PHYSICAL EXAMINATION: ECOG PERFORMANCE STATUS: 0 - Asymptomatic  Filed Vitals:   10/18/13 1313  BP: 123/82  Pulse: 78  Temp: 98.2 F (36.8 C)  Resp: 18   Filed Weights   10/18/13 1313  Weight: 121 lb 11.2 oz (55.203 kg)    GENERAL:alert, no distress and comfortable SKIN: Significant skin bruising EYES: normal, Conjunctiva are pale and non-injected, sclera clear Musculoskeletal:no cyanosis of digits and no clubbing  NEURO: alert & oriented x 3 with fluent speech, no focal motor/sensory deficits  LABORATORY DATA:  I have reviewed the data as listed No results found for this or any previous visit (from the past 48 hour(s)).  Lab Results  Component Value Date   WBC 4.7 09/29/2013   HGB 10.4* 09/29/2013   HCT 30.3* 09/29/2013   MCV 84.6 09/29/2013   PLT 133* 09/29/2013    ASSESSMENT & PLAN:  Coagulopathy She has low fibrinogen, low factor V and the low factor VIII level. I do not believe she has multiple coagulation factor defects at the same time, rather this is implying liver disease. I will CT scan of her abdomen for evaluation. Right now she has no significant bleeding issue for apart from bruising.  Other pancytopenia Leukopenia has resolved. Anemia is stable. Her thrombocytopenia is improving. Overall, I suspect this is due to severe malnutrition. I recommend observation for now. Anemia is likely anemia chronic disease. Overall, she is not symptomatic.      All questions were answered. The  patient knows to call the clinic with any problems, questions or concerns. No barriers to learning was detected.  I spent 15 minutes counseling the patient face to face. The total time spent in the appointment was 20 minutes and more than 50% was on counseling.     [facility], [place], MD 10/18/2013 8:46 PM

## 2013-10-18 NOTE — Assessment & Plan Note (Signed)
She has low fibrinogen, low factor V and the low factor VIII level. I do not believe she has multiple coagulation factor defects at the same time, rather this is implying liver disease. I will CT scan of her abdomen for evaluation. Right now she has no significant bleeding issue for apart from bruising.

## 2013-10-25 ENCOUNTER — Other Ambulatory Visit: Payer: Self-pay | Admitting: Hematology and Oncology

## 2013-10-25 ENCOUNTER — Ambulatory Visit (HOSPITAL_BASED_OUTPATIENT_CLINIC_OR_DEPARTMENT_OTHER): Payer: Medicaid Other

## 2013-10-25 ENCOUNTER — Ambulatory Visit (HOSPITAL_COMMUNITY)
Admission: RE | Admit: 2013-10-25 | Discharge: 2013-10-25 | Disposition: A | Discharge status: Home / Self Care | Payer: Medicaid Other | Source: Ambulatory Visit | Attending: Hematology and Oncology | Admitting: Hematology and Oncology

## 2013-10-25 ENCOUNTER — Encounter (HOSPITAL_COMMUNITY): Payer: Self-pay

## 2013-10-25 ENCOUNTER — Other Ambulatory Visit (HOSPITAL_COMMUNITY)
Admission: RE | Admit: 2013-10-25 | Discharge: 2013-10-25 | Disposition: A | Discharge status: Home / Self Care | Payer: Medicaid Other | Source: Ambulatory Visit | Attending: Hematology and Oncology | Admitting: Hematology and Oncology

## 2013-10-25 DIAGNOSIS — D61818 Other pancytopenia: Secondary | ICD-10-CM | POA: Diagnosis not present

## 2013-10-25 DIAGNOSIS — X58XXXA Exposure to other specified factors, initial encounter: Secondary | ICD-10-CM | POA: Diagnosis not present

## 2013-10-25 DIAGNOSIS — T148XXA Other injury of unspecified body region, initial encounter: Secondary | ICD-10-CM

## 2013-10-25 DIAGNOSIS — Y929 Unspecified place or not applicable: Secondary | ICD-10-CM | POA: Diagnosis not present

## 2013-10-25 DIAGNOSIS — R161 Splenomegaly, not elsewhere classified: Secondary | ICD-10-CM | POA: Diagnosis not present

## 2013-10-25 DIAGNOSIS — D689 Coagulation defect, unspecified: Secondary | ICD-10-CM

## 2013-10-25 LAB — COMPREHENSIVE METABOLIC PANEL (CC13)
ALBUMIN: 4.8 g/dL (ref 3.5–5.0)
ALT: 31 U/L (ref 0–55)
ANION GAP: 8 meq/L (ref 3–11)
AST: 43 U/L — ABNORMAL HIGH (ref 5–34)
Alkaline Phosphatase: 52 U/L (ref 40–150)
BUN: 7.1 mg/dL (ref 7.0–26.0)
CALCIUM: 9.7 mg/dL (ref 8.4–10.4)
CO2: 26 meq/L (ref 22–29)
Chloride: 89 mEq/L — ABNORMAL LOW (ref 98–109)
Creatinine: 0.7 mg/dL (ref 0.6–1.1)
GLUCOSE: 83 mg/dL (ref 70–140)
POTASSIUM: 4.6 meq/L (ref 3.5–5.1)
Sodium: 122 mEq/L — ABNORMAL LOW (ref 136–145)
Total Bilirubin: 0.44 mg/dL (ref 0.20–1.20)
Total Protein: 7.4 g/dL (ref 6.4–8.3)

## 2013-10-25 LAB — CBC WITH DIFFERENTIAL/PLATELET
BASO%: 0.7 % (ref 0.0–2.0)
BASOS ABS: 0 10*3/uL (ref 0.0–0.1)
EOS ABS: 0 10*3/uL (ref 0.0–0.5)
EOS%: 0.3 % (ref 0.0–7.0)
HCT: 30.9 % — ABNORMAL LOW (ref 34.8–46.6)
HEMOGLOBIN: 10 g/dL — AB (ref 11.6–15.9)
LYMPH%: 34.6 % (ref 14.0–49.7)
MCH: 28.5 pg (ref 25.1–34.0)
MCHC: 32.4 g/dL (ref 31.5–36.0)
MCV: 88.1 fL (ref 79.5–101.0)
MONO#: 0.3 10*3/uL (ref 0.1–0.9)
MONO%: 5.9 % (ref 0.0–14.0)
NEUT#: 2.9 10*3/uL (ref 1.5–6.5)
NEUT%: 58.5 % (ref 38.4–76.8)
PLATELETS: 128 10*3/uL — AB (ref 145–400)
RBC: 3.51 10*6/uL — AB (ref 3.70–5.45)
RDW: 14.2 % (ref 11.2–14.5)
WBC: 4.9 10*3/uL (ref 3.9–10.3)
lymph#: 1.7 10*3/uL (ref 0.9–3.3)

## 2013-10-25 LAB — CHCC SMEAR

## 2013-10-25 MED ORDER — IOHEXOL 300 MG/ML  SOLN
100.0000 mL | Freq: Once | INTRAMUSCULAR | Status: AC | PRN
  Administered 2013-10-25: 100 mL via INTRAVENOUS

## 2013-10-26 LAB — SEDIMENTATION RATE: SED RATE: 1 mm/h (ref 0–22)

## 2013-10-26 LAB — PROTHROMBIN TIME
INR: 1.97 — ABNORMAL HIGH (ref <1.50)
PROTHROMBIN TIME: 22.4 s — AB (ref 11.6–15.2)

## 2013-10-26 LAB — FIBRINOGEN: Fibrinogen: 60 mg/dL — ABNORMAL LOW (ref 204–475)

## 2013-10-26 LAB — APTT: aPTT: 41 seconds — ABNORMAL HIGH (ref 24–37)

## 2013-10-27 LAB — FLOW CYTOMETRY

## 2013-10-28 ENCOUNTER — Ambulatory Visit (HOSPITAL_BASED_OUTPATIENT_CLINIC_OR_DEPARTMENT_OTHER): Payer: Medicaid Other | Admitting: Hematology and Oncology

## 2013-10-28 ENCOUNTER — Telehealth: Payer: Self-pay | Admitting: Hematology and Oncology

## 2013-10-28 ENCOUNTER — Encounter: Payer: Self-pay | Admitting: Hematology and Oncology

## 2013-10-28 VITALS — BP 127/84 | HR 80 | Temp 99.6°F | Resp 18 | Ht 69.0 in | Wt 120.0 lb

## 2013-10-28 DIAGNOSIS — D61818 Other pancytopenia: Secondary | ICD-10-CM

## 2013-10-28 DIAGNOSIS — R161 Splenomegaly, not elsewhere classified: Secondary | ICD-10-CM

## 2013-10-28 DIAGNOSIS — D689 Coagulation defect, unspecified: Secondary | ICD-10-CM

## 2013-10-28 HISTORY — DX: Splenomegaly, not elsewhere classified: R16.1

## 2013-10-28 NOTE — Assessment & Plan Note (Signed)
The patient is symptomatic. The appearance on CT scan suggested possibility of hemangioma. With the size of the lesion greater than 4 cm, that his risk of transformation to angiosarcoma risk is spontaneous splenic rupture. I recommend general surgery consultation.

## 2013-10-28 NOTE — Progress Notes (Signed)
[place] OFFICE PROGRESS NOTE  No primary provider on file. SUMMARY OF HEMATOLOGIC HISTORY: She was found to have abnormal CBC from routine blood work monitoring by her primary care provider in May of 2015. At that time, her white blood cell count was 3.4, hemoglobin 10.4, platelet count 78,000 with elevated INR. This patient had prior history of posttraumatic stress disorder, depression, and anorexia nervosa. She had weight loss recently. She complained of intermittent bleeding from her stool. She also has extensive bruising throughout. She denies recent epistaxis, hematuria, or heavy menstruation. In fact, she has not had a period for a long time. The patient denies history of liver disease, exposure to heparin, history of cardiac murmur/prior cardiovascular surgery or recent new medications She denies prior blood or platelet transfusions. On 10/25/2013, CT scan of the abdomen and pelvis revealed a large splenic mass, suspicious for hemangioma. INTERVAL HISTORY: Mary Cooley 28 y.o. female returns for further followup. She has easy bruising. The patient denies any recent signs or symptoms of bleeding such as spontaneous epistaxis, hematuria or hematochezia. She has no left upper quadrant discomfort.  I have reviewed the past medical history, past surgical history, social history and family history with the patient and they are unchanged from previous note.  ALLERGIES:  is allergic to ceclor; peanut-containing drug products; and penicillins.  MEDICATIONS:  Current Outpatient Prescriptions  Medication Sig Dispense Refill  . clonazePAM (KLONOPIN) 0.5 MG tablet Take 0.5 mg by mouth 3 (three) times daily as needed for anxiety.      Marland Kitchen ibuprofen (ADVIL,MOTRIN) 200 MG tablet Take 200-400 mg by mouth every 6 (six) hours as needed for moderate pain.      . Multiple Vitamin (MULTIVITAMIN WITH MINERALS) TABS tablet Take 1 tablet by mouth daily.      . promethazine (PHENERGAN)  12.5 MG tablet Take 12.5-25 mg by mouth every 6 (six) hours as needed for nausea or vomiting.      . sertraline (ZOLOFT) 100 MG tablet Take 100 mg by mouth daily.       No current facility-administered medications for this visit.     REVIEW OF SYSTEMS:   Constitutional: Denies fevers, chills or night sweats Eyes: Denies blurriness of vision Ears, nose, mouth, throat, and face: Denies mucositis or sore throat Respiratory: Denies cough, dyspnea or wheezes Cardiovascular: Denies palpitation, chest discomfort or lower extremity swelling Gastrointestinal:  Denies nausea, heartburn or change in bowel habits Skin: Denies abnormal skin rashes Lymphatics: Denies new lymphadenopathy Neurological:Denies numbness, tingling or new weaknesses Behavioral/Psych: Mood is stable, no new changes  All other systems were reviewed with the patient and are negative.  PHYSICAL EXAMINATION: ECOG PERFORMANCE STATUS: 1 - Symptomatic but completely ambulatory  Filed Vitals:   10/28/13 1407  BP: 127/84  Pulse: 80  Temp: 99.6 F (37.6 C)  Resp: 18   Filed Weights   10/28/13 1407  Weight: 120 lb (54.432 kg)    GENERAL:alert, no distress and comfortable SKIN: Extensive skin bruises are noted. EYES: normal, Conjunctiva are pink and non-injected, sclera clear OROPHARYNX:no exudate, no erythema and lips, buccal mucosa, and tongue normal. Poor dentition is noted Musculoskeletal:no cyanosis of digits and no clubbing  NEURO: alert & oriented x 3 with fluent speech, no focal motor/sensory deficits  LABORATORY DATA:  I have reviewed the data as listed No results found for this or any previous visit (from the past 48 hour(s)).  Lab Results  Component Value Date   WBC 4.9 10/25/2013   HGB 10.0*  10/25/2013   HCT 30.9* 10/25/2013   MCV 88.1 10/25/2013   PLT 128* 10/25/2013    RADIOGRAPHIC STUDIES: I reviewed the CT scan with the patient. I have personally reviewed the radiological images as listed and agreed  with the findings in the report. ASSESSMENT & PLAN:  Splenomegaly The patient is symptomatic. The appearance on CT scan suggested possibility of hemangioma. With the size of the lesion greater than 4 cm, that his risk of transformation to angiosarcoma risk is spontaneous splenic rupture. I recommend general surgery consultation.  Coagulopathy She has coagulopathy due to low fibrinogen. The patient would require cryoprecipitate infusion prior to surgery.    All questions were answered. The patient knows to call the clinic with any problems, questions or concerns. No barriers to learning was detected.  I spent 25 minutes counseling the patient face to face. The total time spent in the appointment was 30 minutes and more than 50% was on counseling.     [facility], [place], MD 10/28/2013 7:39 PM

## 2013-10-28 NOTE — Telephone Encounter (Signed)
RE REFERRAL TO [facility] - PT HAS [place] ACCESS AND NEEDS REFERRAL FROM PCP ON CARD TO SURGERY. PT HAS SEEN PRIMARY ON CARD AND HAS INFO FOR SURGERY. PT WILL CONTACT PCP FOR REFERRAL TO [facility]. DESK NURSE INFORMED. NO F/U W/NG AT THIS TIME.

## 2013-10-28 NOTE — Assessment & Plan Note (Signed)
The pancytopenia is likely due to sequestration from significant splenomegaly. She is not symptomatic. I recommend observation only.

## 2013-10-28 NOTE — Assessment & Plan Note (Signed)
She has coagulopathy due to low fibrinogen. The patient would require cryoprecipitate infusion prior to surgery.

## 2013-11-03 ENCOUNTER — Telehealth: Payer: Self-pay | Admitting: *Deleted

## 2013-11-03 NOTE — Telephone Encounter (Signed)
Pt states she has not seen her PCP in over 1 year and she is now considered a "new patient". They are not seeing "new patients" until October. She has found out that Dr Alvy Bimler can call for an override @ [phone] option 8 to bypass PCP referral. This is in regards to PCP referral to surgeon Dr Barry Dienes for spleenectomy.

## 2013-11-12 ENCOUNTER — Telehealth: Payer: Self-pay | Admitting: *Deleted

## 2013-11-12 NOTE — Telephone Encounter (Signed)
Pt unable to get appt to see her PCP in timely manner to get referral to Surgeon.  Medicaid not allowing referral to be made by Dr. Alvy Bimler,  It has to be done by PCP.   Dr. Alvy Bimler and this RN called Medicaid attempting to get "override" so Dr. Alvy Bimler could make the surgical referral.   Medicaid did not allow this due to pt does not have appt to see Surgeon yet (and can't get one until referral is made.) Spoke w/ pt today and she states she has seen PCP, Dr. Orma Render, at [facility].  He is willing to see pt soon and he is the MD who referred pt here in the first place.  Unfortunately he is not the PCP listed on pt's Medicaid card and he needs to be the listed PCP to be allowed to make the referral.  Pt is trying to call Medicaid today to get Dr. Orma Render listed as the PCP and then she will make appt to see him.  She will let us know when this happens.  She understands importance of getting in to see Surgeon.

## 2013-12-23 ENCOUNTER — Telehealth: Payer: Self-pay | Admitting: Hematology & Oncology

## 2013-12-23 NOTE — Telephone Encounter (Signed)
Received referral Mary Cooley at referring aware Dr. Alvy Bimler is seeing pt. I gave her [phone] number

## 2014-02-16 ENCOUNTER — Other Ambulatory Visit (INDEPENDENT_AMBULATORY_CARE_PROVIDER_SITE_OTHER): Payer: Self-pay | Admitting: Surgery

## 2014-02-16 NOTE — H&P (Signed)
Mary Cooley 02/16/2014 9:21 AM Location: [facility] Patient #: [id number] DOB: [birth year] Single / Language: [name] / Race: White Female History of Present Illness Adin Hector MD; 02/16/2014 12:44 PM) Patient words: splenectomy.  The patient is a 28 year old female who presents with a splenic disorder. 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  Pleasant thin female. 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 Also mild/moderate thrombocytopenia. Has had worsening abdominal pain. Had a CAT scan which revealed 12.5 cm vascular mass on spleen suspicious for giant hemangioma. Given the large size with risk of sarcoma transformation in the fact that she has a bleeding disorder that cannot tolerate any spontaneous rupture, surgical consultation requested. Patient notes she has pain in her upper abdomen that bothers her. Usually left-sided. She is concerned about the mass and wishes to have it removed. She does have issues where she easily bruises and cots with bleeding. Has not required much in the way of blood product transfusions. She does have PTSD and other health issues. Has some irregular bowel movements with occasional constipation or diarrhea. No major rectal bleeding. Never had any abdominal surgery. Her weight has been stable. She does smoke maybe a pack or 2 a week. Due to severe poverty cannot afford cigarettes anyway. Just got on Medicaid. She comes today with her common-law husband.    CLINICAL DATA: Pancytopenia. Clotting disorder.  EXAM: CT ABDOMEN AND PELVIS WITH CONTRAST  TECHNIQUE: Multidetector CT imaging of the abdomen and pelvis was performed using the standard protocol following bolus administration of intravenous contrast.  CONTRAST: 158mL OMNIPAQUE IOHEXOL 300 MG/ML SOLN  COMPARISON:  None.  FINDINGS: There is no pleural or pericardial effusion.  No focal liver abnormality. The gallbladder appears normal. There is no biliary dilatation. Normal appearance of the pancreas. The spleen measures 19.5 cm in length. This contains a large low-attenuation mass with peripheral, nodular enhancement which measures 11.7 x 12.5 x 6.7 cm.  The adrenal glands are normal. Normal appearance of the kidneys. The urinary bladder appears normal. The uterus and the adnexal structures have a normal appearance for patient's age.  Normal caliber of the abdominal aorta. No aneurysm. There is no retroperitoneal adenopathy identified. There is no pelvic or inguinal adenopathy.  The stomach is displaced medially due to the large splenic mass. The small bowel loops have a normal course and caliber. There is no obstruction. Normal appearance of the colon.  Review of the visualized osseous structures is unremarkable.  IMPRESSION: 1. No acute findings within the abdomen or pelvis. 2. Large mass in the spleen is favored to represent a giant hemangioma.   Electronically Signed By: Kerby Moors M.D. On: 10/25/2013 16:24  Other Problems (Mary Cooley, [place]; 02/16/2014 9:21 AM) Anxiety Disorder Bladder Problems Depression Other disease, cancer, significant illness  Past Surgical History Mary Cooley, [place]; 02/16/2014 9:21 AM) No pertinent past surgical history  Diagnostic Studies History Mary Cooley, CMA; 02/16/2014 9:21 AM) Colonoscopy never Mammogram never Pap Smear 1-5 years ago  Allergies Mary Cooley, CMA; 02/16/2014 9:23 AM) Ceclor *CEPHALOSPORINS* Penicillamine *ASSORTED CLASSES*  Medication History (Mary Cooley, CMA; 02/16/2014 9:23 AM) KlonoPIN (0.5MG  Tablet, Oral) Active. Multivitamins (Oral) Active. Sertraline HCl (100MG  Tablet, Oral) Active. Advil PM (200-25MG  Capsule, Oral) Active.  Pregnancy / Birth History Mary Cooley, [place]; 02/16/2014 9:21 AM) Age  at menarche 21 years. Contraceptive History Oral contraceptives. Gravida 1 Irregular periods  Maternal age 79-25 Para 0     Review of Systems (Mary Cooley; 02/16/2014 9:21 AM) General Present- Appetite Loss, Chills, Fatigue and Weight Loss. Not Present- Fever, Night Sweats and Weight Gain. Skin Present- Non-Healing Wounds. Not Present- Change in Wart/Mole, Dryness, Hives, Jaundice, New Lesions, Rash and Ulcer. HEENT Present- Earache, Hearing Loss, Ringing in the Ears, Seasonal Allergies, Sinus Pain, Sore Throat and Wears glasses/contact lenses. Not Present- Hoarseness, Nose Bleed, Oral Ulcers, Visual Disturbances and Yellow Eyes. Respiratory Not Present- Bloody sputum, Chronic Cough, Difficulty Breathing, Snoring and Wheezing. Breast Not Present- Breast Mass, Breast Pain, Nipple Discharge and Skin Changes. Cardiovascular Not Present- Chest Pain, Difficulty Breathing Lying Down, Leg Cramps, Palpitations, Rapid Heart Rate, Shortness of Breath and Swelling of Extremities. Gastrointestinal Present- Abdominal Pain, Bloating, Bloody Stool, Change in Bowel Habits, Chronic diarrhea, Constipation, Excessive gas, Gets full quickly at meals and Nausea. Not Present- Difficulty Swallowing, Hemorrhoids, Indigestion, Rectal Pain and Vomiting. Female Genitourinary Present- Frequency, Nocturia and Urgency. Not Present- Painful Urination and Pelvic Pain. Musculoskeletal Present- Back Pain, Joint Pain and Muscle Weakness. Not Present- Joint Stiffness, Muscle Pain and Swelling of Extremities. Neurological Present- Decreased Memory, Headaches, Tingling, Trouble walking and Weakness. Not Present- Fainting, Numbness, Seizures and Tremor. Psychiatric Present- Anxiety, Depression, Fearful and Frequent crying. Not Present- Bipolar and Change in Sleep Pattern. Endocrine Present- Cold Intolerance. Not Present- Excessive Hunger, Hair Changes, Heat Intolerance, Hot flashes and New Diabetes. Hematology Present-  Easy Bruising and Excessive bleeding. Not Present- Gland problems, HIV and Persistent Infections.  Vitals (Mary Cooley CMA; 02/16/2014 9:22 AM) 02/16/2014 9:22 AM Weight: 116 lb Height: 68in Body Surface Area: 1.59 m Body Mass Index: 17.64 kg/m Temp.: 67F(Temporal)  Pulse: 76 (Regular)  BP: 122/74 (Sitting, Left Arm, Standard)     Physical Exam Adin Hector MD; 02/16/2014 10:57 AM)  General Mental Status-Alert. General Appearance-Not in acute distress, Not Sickly. Orientation-Oriented X3. Hydration-Well hydrated. Voice-Normal.  Integumentary Global Assessment Upon inspection and palpation of skin surfaces of the - Axillae: non-tender, no inflammation or ulceration, no drainage. and Distribution of scalp and body hair is normal. General Characteristics Temperature - normal warmth is noted.  Head and Neck Head-normocephalic, atraumatic with no lesions or palpable masses. Face Global Assessment - atraumatic, no absence of expression. Neck Global Assessment - no abnormal movements, no bruit auscultated on the right, no bruit auscultated on the left, no decreased range of motion, non-tender. Trachea-midline. Thyroid Gland Characteristics - non-tender.  Eye Eyeball - Left-Extraocular movements intact, No Nystagmus. Eyeball - Right-Extraocular movements intact, No Nystagmus. Cornea - Left-No Hazy. Cornea - Right-No Hazy. Sclera/Conjunctiva - Left-No scleral icterus, No Discharge. Sclera/Conjunctiva - Right-No scleral icterus, No Discharge. Pupil - Left-Direct reaction to light normal. Pupil - Right-Direct reaction to light normal.  ENMT Ears Pinna - Left - no drainage observed, no generalized tenderness observed. Right - no drainage observed, no generalized tenderness observed. Nose and Sinuses External Inspection of the Nose - no destructive lesion observed. Inspection of the nares - Left - quiet respiration. Right - quiet  respiration. Mouth and Throat Lips - Upper Lip - no fissures observed, no pallor noted. Lower Lip - no fissures observed, no pallor noted. Nasopharynx - no discharge present. Oral Cavity/Oropharynx - Tongue - no dryness observed. Oral Mucosa - no cyanosis observed. Hypopharynx - no evidence of airway distress observed.  Chest and Lung Exam Inspection Movements - Normal and Symmetrical. Accessory muscles - No use of accessory muscles in breathing. Palpation Palpation of the chest reveals -  Non-tender. Auscultation Breath sounds - Normal and Clear.  Cardiovascular Auscultation Rhythm - Regular. Murmurs & Other Heart Sounds - Auscultation of the heart reveals - No Murmurs and No Systolic Clicks.  Abdomen Inspection Inspection of the abdomen reveals - No Visible peristalsis and No Abnormal pulsations. Umbilicus - No Bleeding, No Urine drainage. Palpation/Percussion Palpation and Percussion of the abdomen reveal - Soft, Non Tender, No Rebound tenderness, No Rigidity (guarding) and No Cutaneous hyperesthesia. Note: Abdomen soft and flat. Some fullness & tenderness in the left upper quadrant consistent with splenomegaly.   Female Genitourinary Sexual Maturity Tanner 5 - Adult hair pattern. Note: No vaginal bleeding nor discharge   Peripheral Vascular Upper Extremity Inspection - Left - No Cyanotic nailbeds, Not Ischemic. Right - No Cyanotic nailbeds, Not Ischemic.  Neurologic Neurologic evaluation reveals -normal attention span and ability to concentrate, able to name objects and repeat phrases. Appropriate fund of knowledge , normal sensation and normal coordination. Mental Status Affect - not angry, not paranoid. Cranial Nerves-Normal Bilaterally. Gait-Normal.  Neuropsychiatric Mental status exam performed with findings of-able to articulate well with normal speech/language, rate, volume and coherence, thought content normal with ability to perform basic computations  and apply abstract reasoning and no evidence of hallucinations, delusions, obsessions or homicidal/suicidal ideation. Note: Mildly odd affect but pleasant.   Musculoskeletal Global Assessment Spine, Ribs and Pelvis - no instability, subluxation or laxity. Right Upper Extremity - no instability, subluxation or laxity.  Lymphatic Head & Neck  General Head & Neck Lymphatics: Bilateral - Description - No Localized lymphadenopathy. Axillary  General Axillary Region: Bilateral - Description - No Localized lymphadenopathy. Femoral & Inguinal  Generalized Femoral & Inguinal Lymphatics: Left - Description - No Localized lymphadenopathy. Right - Description - No Localized lymphadenopathy.    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 Given size risk of sarcoma. Also with mild thrombocytopenia. Given coagulopathy, cannot tolerate the episode of spontaneous bleeding. Patient wished to be aggressive and have it removed.  We'll work to set up a preoperative immunizations for meningococcus, pneumococcus, H. influenzae. Hopefully can be done through primary care physician versus Public health Department.  Current Plans Schedule for Surgery [facility] Consent - Splenectomy (Mylan Schwarz): discussed with patient and provided information. Instructions:  Consider surgery to remove here spleen which has a large cluster blood vessels (hemangioma versus sarcoma).  Discussed with her hematologist. Plan to give cryoprecipitate around the time of surgery to minimize the chance of bleeding problems given her history of low fibrinogen.  He will benefit from having immunizations to prevent infections to meningococcus, pneumococcus, Haemophilus influenza. We will work to see if this can be done through your primary care office versus Public health clinic. 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 Dr. Rozetta Nunnery mentions using cryoprecipitate. Discussed with hematologist. Would begin to have it in her system for at least an hour or so. Plan to give cryoprecipitate on arrival to the hospital and extra intraoperatively.  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 Not severely debilitating require platelet transfusion perioperatively. We'll discuss with her hematologist.

## 2014-03-10 ENCOUNTER — Other Ambulatory Visit (INDEPENDENT_AMBULATORY_CARE_PROVIDER_SITE_OTHER): Payer: Self-pay | Admitting: Internal Medicine

## 2014-03-18 ENCOUNTER — Other Ambulatory Visit (HOSPITAL_COMMUNITY): Payer: Self-pay | Admitting: *Deleted

## 2014-03-18 NOTE — Patient Instructions (Addendum)
Mary Cooley  03/18/2014   Your procedure is scheduled on: Friday January 22nd, 2016  Report to [facility] Main  Entrance and follow signs to               [place] at 930  AM.  Call this number if you have problems the morning of surgery [phone]   Remember:  Do not eat food or drink liquids :After Midnight.     Take these medicines the morning of surgery with A SIP OF WATER: Klonopin, Sertraline                               You may not have any metal on your body including hair pins and              piercings  Do not wear jewelry, make-up, lotions, powders or perfumes.             Do not wear nail polish.  Do not shave  48 hours prior to surgery.              Men may shave face and neck.   Do not bring valuables to the hospital. [place].  Contacts, dentures or bridgework may not be worn into surgery.  Leave suitcase in the car. After surgery it may be brought to your room.     Patients discharged the day of surgery will not be allowed to drive home.  Name and phone number of your driver:  Special Instructions: N/A              Please read over the following fact sheets you were given: _____________________________________________________________________             [facility] - Preparing for Surgery Before surgery, you can play an important role.  Because skin is not sterile, your skin needs to be as free of germs as possible.  You can reduce the number of germs on your skin by washing with CHG (chlorahexidine gluconate) soap before surgery.  CHG is an antiseptic cleaner which kills germs and bonds with the skin to continue killing germs even after washing. Please DO NOT use if you have an allergy to CHG or antibacterial soaps.  If your skin becomes reddened/irritated stop using the CHG and inform your nurse when you arrive at Short Stay. Do not shave (including legs and underarms)  for at least 48 hours prior to the first CHG shower.  You may shave your face/neck. Please follow these instructions carefully:  1.  Shower with CHG Soap the night before surgery and the  morning of Surgery.  2.  If you choose to wash your hair, wash your hair first as usual with your  normal  shampoo.  3.  After you shampoo, rinse your hair and body thoroughly to remove the  shampoo.                           4.  Use CHG as you would any other liquid soap.  You can apply chg directly  to the skin and wash  Gently with a scrungie or clean washcloth.  5.  Apply the CHG Soap to your body ONLY FROM THE NECK DOWN.   Do not use on face/ open                           Wound or open sores. Avoid contact with eyes, ears mouth and genitals (private parts).                       Wash face,  Genitals (private parts) with your normal soap.             6.  Wash thoroughly, paying special attention to the area where your surgery  will be performed.  7.  Thoroughly rinse your body with warm water from the neck down.  8.  DO NOT shower/wash with your normal soap after using and rinsing off  the CHG Soap.                9.  Pat yourself dry with a clean towel.            10.  Wear clean pajamas.            11.  Place clean sheets on your bed the night of your first shower and do not  sleep with pets. Day of Surgery : Do not apply any lotions/deodorants the morning of surgery.  Please wear clean clothes to the hospital/surgery center.  FAILURE TO FOLLOW THESE INSTRUCTIONS MAY RESULT IN THE CANCELLATION OF YOUR SURGERY PATIENT SIGNATURE_________________________________  NURSE SIGNATURE__________________________________  ________________________________________________________________________  WHAT IS A BLOOD TRANSFUSION? Blood Transfusion Information  A transfusion is the replacement of blood or some of its parts. Blood is made up of multiple cells which provide different  functions.  Red blood cells carry oxygen and are used for blood loss replacement.  White blood cells fight against infection.  Platelets control bleeding.  Plasma helps clot blood.  Other blood products are available for specialized needs, such as hemophilia or other clotting disorders. BEFORE THE TRANSFUSION  Who gives blood for transfusions?   Healthy volunteers who are fully evaluated to make sure their blood is safe. This is blood bank blood. Transfusion therapy is the safest it has ever been in the practice of medicine. Before blood is taken from a donor, a complete history is taken to make sure that person has no history of diseases nor engages in risky social behavior (examples are intravenous drug use or sexual activity with multiple partners). The donor's travel history is screened to minimize risk of transmitting infections, such as malaria. The donated blood is tested for signs of infectious diseases, such as HIV and hepatitis. The blood is then tested to be sure it is compatible with you in order to minimize the chance of a transfusion reaction. If you or a relative donates blood, this is often done in anticipation of surgery and is not appropriate for emergency situations. It takes many days to process the donated blood. RISKS AND COMPLICATIONS Although transfusion therapy is very safe and saves many lives, the main dangers of transfusion include:   Getting an infectious disease.  Developing a transfusion reaction. This is an allergic reaction to something in the blood you were given. Every precaution is taken to prevent this. The decision to have a blood transfusion has been considered carefully by your caregiver before blood is given. Blood is not given unless the benefits outweigh  the risks. AFTER THE TRANSFUSION  Right after receiving a blood transfusion, you will usually feel much better and more energetic. This is especially true if your red blood cells have gotten low  (anemic). The transfusion raises the level of the red blood cells which carry oxygen, and this usually causes an energy increase.  The nurse administering the transfusion will monitor you carefully for complications. HOME CARE INSTRUCTIONS  No special instructions are needed after a transfusion. You may find your energy is better. Speak with your caregiver about any limitations on activity for underlying diseases you may have. SEEK MEDICAL CARE IF:   Your condition is not improving after your transfusion.  You develop redness or irritation at the intravenous (IV) site. SEEK IMMEDIATE MEDICAL CARE IF:  Any of the following symptoms occur over the next 12 hours:  Shaking chills.  You have a temperature by mouth above 102 F (38.9 C), not controlled by medicine.  Chest, back, or muscle pain.  People around you feel you are not acting correctly or are confused.  Shortness of breath or difficulty breathing.  Dizziness and fainting.  You get a rash or develop hives.  You have a decrease in urine output.  Your urine turns a dark color or changes to pink, red, or brown. Any of the following symptoms occur over the next 10 days:  You have a temperature by mouth above 102 F (38.9 C), not controlled by medicine.  Shortness of breath.  Weakness after normal activity.  The white part of the eye turns yellow (jaundice).  You have a decrease in the amount of urine or are urinating less often.  Your urine turns a dark color or changes to pink, red, or brown. Document Released: 02/16/2000 Document Revised: 05/13/2011 Document Reviewed: 10/05/2007 [facility] Patient Information 2014 [place], [place].  _______________________________________________________________________

## 2014-03-22 ENCOUNTER — Encounter (HOSPITAL_COMMUNITY): Payer: Self-pay

## 2014-03-22 ENCOUNTER — Encounter (HOSPITAL_COMMUNITY)
Admission: RE | Admit: 2014-03-22 | Discharge: 2014-03-22 | Disposition: A | Discharge status: Home / Self Care | Payer: Medicaid Other | Source: Ambulatory Visit | Attending: Surgery | Admitting: Surgery

## 2014-03-22 DIAGNOSIS — R0602 Shortness of breath: Secondary | ICD-10-CM | POA: Diagnosis not present

## 2014-03-22 DIAGNOSIS — Z01818 Encounter for other preprocedural examination: Secondary | ICD-10-CM | POA: Diagnosis present

## 2014-03-22 HISTORY — DX: Unspecified convulsions: R56.9

## 2014-03-22 HISTORY — DX: Other injury of unspecified body region, initial encounter: T14.8XXA

## 2014-03-22 HISTORY — DX: Headache: R51

## 2014-03-22 HISTORY — DX: Reserved for inherently not codable concepts without codable children: IMO0001

## 2014-03-22 HISTORY — DX: Other abnormalities of gait and mobility: R26.89

## 2014-03-22 HISTORY — DX: Unspecified parasitic disease: B89

## 2014-03-22 HISTORY — DX: Headache, unspecified: R51.9

## 2014-03-22 HISTORY — DX: Major depressive disorder, single episode, unspecified: F32.9

## 2014-03-22 HISTORY — DX: Depression, unspecified: F32.A

## 2014-03-22 LAB — PROTIME-INR
INR: 1.51 — AB (ref 0.00–1.49)
PROTHROMBIN TIME: 18.4 s — AB (ref 11.6–15.2)

## 2014-03-22 LAB — CBC
HEMATOCRIT: 27.3 % — AB (ref 36.0–46.0)
Hemoglobin: 9.2 g/dL — ABNORMAL LOW (ref 12.0–15.0)
MCH: 30.6 pg (ref 26.0–34.0)
MCHC: 33.7 g/dL (ref 30.0–36.0)
MCV: 90.7 fL (ref 78.0–100.0)
Platelets: 125 10*3/uL — ABNORMAL LOW (ref 150–400)
RBC: 3.01 MIL/uL — ABNORMAL LOW (ref 3.87–5.11)
RDW: 14.3 % (ref 11.5–15.5)
WBC: 4.2 10*3/uL (ref 4.0–10.5)

## 2014-03-22 LAB — BASIC METABOLIC PANEL
Anion gap: 9 (ref 5–15)
BUN: 8 mg/dL (ref 6–23)
CO2: 27 mmol/L (ref 19–32)
CREATININE: 0.49 mg/dL — AB (ref 0.50–1.10)
Calcium: 10 mg/dL (ref 8.4–10.5)
Chloride: 92 mEq/L — ABNORMAL LOW (ref 96–112)
GFR calc Af Amer: >90 mL/min (ref >90)
GFR calc non Af Amer: >90 mL/min (ref >90)
GLUCOSE: 79 mg/dL (ref 70–99)
Potassium: 3.9 mmol/L (ref 3.5–5.1)
Sodium: 128 mmol/L — ABNORMAL LOW (ref 135–145)

## 2014-03-22 LAB — APTT: APTT: 39 s — AB (ref 24–37)

## 2014-03-22 LAB — HCG, SERUM, QUALITATIVE: Preg, Serum: NEGATIVE

## 2014-03-22 NOTE — Progress Notes (Signed)
Cbc and bmet reults routed to dr gross inbasket by epic

## 2014-03-22 NOTE — Progress Notes (Addendum)
Spoke with Gwenlyn Perking RN, short stay is to hang  1st bag of cryoprecipitates am of surgery and OR holding will hand and finish 2nd bag of cryoprecipitates, spoke with San Jetty RN patient need to arrive 3 hours before surgery on 03-25-2014, spoke with melinda blood bank, draw patient type and screen day of pre op due to needing cryoprecipitates.

## 2014-03-22 NOTE — Progress Notes (Signed)
Patient refused chest xray with pre op visit  due to medicaid may not cover it.patient says she is short of breath all the time due to enlarged spleen

## 2014-03-23 DIAGNOSIS — F4310 Post-traumatic stress disorder, unspecified: Secondary | ICD-10-CM | POA: Diagnosis not present

## 2014-03-23 DIAGNOSIS — F419 Anxiety disorder, unspecified: Secondary | ICD-10-CM | POA: Diagnosis not present

## 2014-03-23 DIAGNOSIS — D682 Hereditary deficiency of other clotting factors: Secondary | ICD-10-CM | POA: Diagnosis not present

## 2014-03-23 DIAGNOSIS — F845 Asperger's syndrome: Secondary | ICD-10-CM | POA: Diagnosis not present

## 2014-03-23 DIAGNOSIS — D649 Anemia, unspecified: Secondary | ICD-10-CM | POA: Diagnosis not present

## 2014-03-23 DIAGNOSIS — F329 Major depressive disorder, single episode, unspecified: Secondary | ICD-10-CM | POA: Diagnosis not present

## 2014-03-23 DIAGNOSIS — F17210 Nicotine dependence, cigarettes, uncomplicated: Secondary | ICD-10-CM | POA: Diagnosis not present

## 2014-03-23 DIAGNOSIS — D1809 Hemangioma of other sites: Secondary | ICD-10-CM | POA: Diagnosis present

## 2014-03-23 DIAGNOSIS — D696 Thrombocytopenia, unspecified: Secondary | ICD-10-CM | POA: Diagnosis not present

## 2014-03-23 LAB — ABO/RH: ABO/RH(D): A POS

## 2014-03-23 NOTE — Progress Notes (Signed)
Spoke with sylvia triage nurse dr gross, faxed last office visit note 03-11-14 dr [name] to dr gross attention alicia, made sylvia aware pt had large hard area behind left knee at pre op visit 03-22-14, sylvia will get office visit note to dr gross and make him aware of area behind left knee.

## 2014-03-30 MED ORDER — GENTAMICIN SULFATE 40 MG/ML IJ SOLN
5.0000 mg/kg | INTRAVENOUS | Status: AC
  Administered 2014-03-31: 260 mg via INTRAVENOUS
  Filled 2014-03-30: qty 6.5

## 2014-03-31 ENCOUNTER — Inpatient Hospital Stay (HOSPITAL_COMMUNITY): Payer: Medicaid Other | Admitting: Anesthesiology

## 2014-03-31 ENCOUNTER — Observation Stay (HOSPITAL_COMMUNITY)
Admission: RE | Admit: 2014-03-31 | Discharge: 2014-04-02 | Disposition: A | Discharge status: Home / Self Care | Payer: Medicaid Other | Source: Ambulatory Visit | Attending: Surgery | Admitting: Surgery

## 2014-03-31 ENCOUNTER — Encounter (HOSPITAL_COMMUNITY)
Admission: RE | Disposition: A | Discharge status: Home / Self Care | Payer: Self-pay | Source: Ambulatory Visit | Attending: Surgery

## 2014-03-31 ENCOUNTER — Encounter (HOSPITAL_COMMUNITY): Payer: Self-pay | Admitting: *Deleted

## 2014-03-31 DIAGNOSIS — F419 Anxiety disorder, unspecified: Secondary | ICD-10-CM | POA: Insufficient documentation

## 2014-03-31 DIAGNOSIS — F4310 Post-traumatic stress disorder, unspecified: Secondary | ICD-10-CM | POA: Diagnosis not present

## 2014-03-31 DIAGNOSIS — T148XXA Other injury of unspecified body region, initial encounter: Secondary | ICD-10-CM

## 2014-03-31 DIAGNOSIS — F17210 Nicotine dependence, cigarettes, uncomplicated: Secondary | ICD-10-CM | POA: Insufficient documentation

## 2014-03-31 DIAGNOSIS — D1809 Hemangioma of other sites: Principal | ICD-10-CM | POA: Insufficient documentation

## 2014-03-31 DIAGNOSIS — D696 Thrombocytopenia, unspecified: Secondary | ICD-10-CM | POA: Insufficient documentation

## 2014-03-31 DIAGNOSIS — D682 Hereditary deficiency of other clotting factors: Secondary | ICD-10-CM | POA: Insufficient documentation

## 2014-03-31 DIAGNOSIS — D689 Coagulation defect, unspecified: Secondary | ICD-10-CM | POA: Diagnosis present

## 2014-03-31 DIAGNOSIS — F845 Asperger's syndrome: Secondary | ICD-10-CM | POA: Insufficient documentation

## 2014-03-31 DIAGNOSIS — D61818 Other pancytopenia: Secondary | ICD-10-CM

## 2014-03-31 DIAGNOSIS — F329 Major depressive disorder, single episode, unspecified: Secondary | ICD-10-CM | POA: Insufficient documentation

## 2014-03-31 DIAGNOSIS — R161 Splenomegaly, not elsewhere classified: Secondary | ICD-10-CM | POA: Diagnosis present

## 2014-03-31 DIAGNOSIS — D649 Anemia, unspecified: Secondary | ICD-10-CM | POA: Insufficient documentation

## 2014-03-31 HISTORY — PX: LAPAROSCOPIC SPLENECTOMY: SHX409

## 2014-03-31 LAB — PREPARE RBC (CROSSMATCH)

## 2014-03-31 LAB — PROTIME-INR
INR: 1.89 — AB (ref 0.00–1.49)
PROTHROMBIN TIME: 21.9 s — AB (ref 11.6–15.2)

## 2014-03-31 SURGERY — SPLENECTOMY, LAPAROSCOPIC
Anesthesia: General | Site: Abdomen

## 2014-03-31 MED ORDER — LIDOCAINE HCL (CARDIAC) 20 MG/ML IV SOLN
INTRAVENOUS | Status: DC | PRN
  Administered 2014-03-31: 50 mg via INTRAVENOUS

## 2014-03-31 MED ORDER — CISATRACURIUM BESYLATE 20 MG/10ML IV SOLN
INTRAVENOUS | Status: AC
  Filled 2014-03-31: qty 10

## 2014-03-31 MED ORDER — CISATRACURIUM BESYLATE (PF) 10 MG/5ML IV SOLN
INTRAVENOUS | Status: DC | PRN
  Administered 2014-03-31: 6 mg via INTRAVENOUS
  Administered 2014-03-31: 2 mg via INTRAVENOUS
  Administered 2014-03-31: 4 mg via INTRAVENOUS

## 2014-03-31 MED ORDER — ONDANSETRON HCL 4 MG/2ML IJ SOLN
INTRAMUSCULAR | Status: DC | PRN
  Administered 2014-03-31: 4 mg via INTRAVENOUS

## 2014-03-31 MED ORDER — CLONAZEPAM 0.5 MG PO TABS
0.5000 mg | ORAL_TABLET | Freq: Three times a day (TID) | ORAL | Status: DC | PRN
  Administered 2014-04-01: 0.5 mg via ORAL
  Filled 2014-03-31: qty 1

## 2014-03-31 MED ORDER — BISACODYL 10 MG RE SUPP: 10.0000 mg | Freq: Two times a day (BID) | RECTAL | Status: DC | PRN

## 2014-03-31 MED ORDER — PROPOFOL 10 MG/ML IV BOLUS
INTRAVENOUS | Status: AC
  Filled 2014-03-31: qty 20

## 2014-03-31 MED ORDER — DEXAMETHASONE SODIUM PHOSPHATE 10 MG/ML IJ SOLN
INTRAMUSCULAR | Status: DC | PRN
  Administered 2014-03-31: 10 mg via INTRAVENOUS

## 2014-03-31 MED ORDER — ONDANSETRON HCL 4 MG/2ML IJ SOLN: 4.0000 mg | Freq: Four times a day (QID) | INTRAMUSCULAR | Status: DC | PRN

## 2014-03-31 MED ORDER — ONDANSETRON 4 MG PO TBDP: 4.0000 mg | ORAL_TABLET | Freq: Four times a day (QID) | ORAL | Status: DC | PRN

## 2014-03-31 MED ORDER — ONDANSETRON HCL 4 MG PO TABS: 4.0000 mg | ORAL_TABLET | Freq: Four times a day (QID) | ORAL | Status: DC | PRN

## 2014-03-31 MED ORDER — PROPOFOL 10 MG/ML IV BOLUS
INTRAVENOUS | Status: DC | PRN
  Administered 2014-03-31: 100 mg via INTRAVENOUS

## 2014-03-31 MED ORDER — SODIUM CHLORIDE 0.9 % IV SOLN: Freq: Once | INTRAVENOUS | Status: DC

## 2014-03-31 MED ORDER — POLYETHYLENE GLYCOL 3350 17 G PO PACK: 17.0000 g | PACK | Freq: Two times a day (BID) | ORAL | Status: DC | PRN

## 2014-03-31 MED ORDER — LACTATED RINGERS IR SOLN
Status: DC | PRN
  Administered 2014-03-31: 1000 mL

## 2014-03-31 MED ORDER — LACTATED RINGERS IV SOLN
INTRAVENOUS | Status: DC
  Administered 2014-03-31 – 2014-04-01 (×2): via INTRAVENOUS

## 2014-03-31 MED ORDER — NEOSTIGMINE METHYLSULFATE 10 MG/10ML IV SOLN
INTRAVENOUS | Status: DC | PRN
  Administered 2014-03-31: 3 mg via INTRAVENOUS

## 2014-03-31 MED ORDER — ALUM & MAG HYDROXIDE-SIMETH 200-200-20 MG/5ML PO SUSP
30.0000 mL | Freq: Four times a day (QID) | ORAL | Status: DC | PRN
  Administered 2014-04-01 (×2): 30 mL via ORAL
  Filled 2014-03-31 (×2): qty 30

## 2014-03-31 MED ORDER — CLINDAMYCIN PHOSPHATE 600 MG/50ML IV SOLN
600.0000 mg | INTRAVENOUS | Status: AC
  Administered 2014-03-31: 600 mg via INTRAVENOUS
  Filled 2014-03-31: qty 50

## 2014-03-31 MED ORDER — PHENOL 1.4 % MT LIQD
2.0000 | OROMUCOSAL | Status: DC | PRN
  Administered 2014-04-01: 2 via OROMUCOSAL
  Filled 2014-03-31: qty 177

## 2014-03-31 MED ORDER — OXYCODONE HCL 5 MG PO TABS
5.0000 mg | ORAL_TABLET | ORAL | Status: DC | PRN
  Administered 2014-04-01: 5 mg via ORAL
  Filled 2014-03-31: qty 1

## 2014-03-31 MED ORDER — LACTATED RINGERS IV SOLN
INTRAVENOUS | Status: DC
  Administered 2014-03-31: 17:00:00 via INTRAVENOUS
  Administered 2014-03-31: 1000 mL via INTRAVENOUS

## 2014-03-31 MED ORDER — ONDANSETRON 8 MG/NS 50 ML IVPB
8.0000 mg | Freq: Four times a day (QID) | INTRAVENOUS | Status: DC | PRN
Start: 2014-03-31 — End: 2014-04-02
  Filled 2014-03-31: qty 8

## 2014-03-31 MED ORDER — OXYCODONE HCL 5 MG PO TABS: 5.0000 mg | ORAL_TABLET | Freq: Once | ORAL | Status: DC | PRN

## 2014-03-31 MED ORDER — MAGIC MOUTHWASH
15.0000 mL | Freq: Four times a day (QID) | ORAL | Status: DC | PRN
  Filled 2014-03-31: qty 15

## 2014-03-31 MED ORDER — BUPIVACAINE-EPINEPHRINE 0.25% -1:200000 IJ SOLN
INTRAMUSCULAR | Status: DC | PRN
  Administered 2014-03-31: 50 mL

## 2014-03-31 MED ORDER — 0.9 % SODIUM CHLORIDE (POUR BTL) OPTIME
TOPICAL | Status: DC | PRN
  Administered 2014-03-31: 2000 mL

## 2014-03-31 MED ORDER — BUPIVACAINE-EPINEPHRINE 0.25% -1:200000 IJ SOLN
INTRAMUSCULAR | Status: AC
  Filled 2014-03-31: qty 1

## 2014-03-31 MED ORDER — SODIUM CHLORIDE 0.9 % IJ SOLN
INTRAMUSCULAR | Status: AC
  Filled 2014-03-31: qty 10

## 2014-03-31 MED ORDER — SERTRALINE HCL 100 MG PO TABS
100.0000 mg | ORAL_TABLET | Freq: Every morning | ORAL | Status: DC
  Administered 2014-04-01: 100 mg via ORAL
  Filled 2014-03-31 (×2): qty 1

## 2014-03-31 MED ORDER — ONDANSETRON HCL 4 MG/2ML IJ SOLN
INTRAMUSCULAR | Status: AC
  Filled 2014-03-31: qty 2

## 2014-03-31 MED ORDER — PNEUMOCOCCAL VAC POLYVALENT 25 MCG/0.5ML IJ INJ: 0.5000 mL | INJECTION | INTRAMUSCULAR | Status: DC | PRN

## 2014-03-31 MED ORDER — SUFENTANIL CITRATE 50 MCG/ML IV SOLN
INTRAVENOUS | Status: DC | PRN
  Administered 2014-03-31 (×3): 10 ug via INTRAVENOUS

## 2014-03-31 MED ORDER — ADULT MULTIVITAMIN W/MINERALS CH
1.0000 | ORAL_TABLET | Freq: Every day | ORAL | Status: DC
  Administered 2014-03-31 – 2014-04-01 (×2): 1 via ORAL
  Filled 2014-03-31 (×3): qty 1

## 2014-03-31 MED ORDER — MIDAZOLAM HCL 2 MG/2ML IJ SOLN
INTRAMUSCULAR | Status: AC
  Filled 2014-03-31: qty 2

## 2014-03-31 MED ORDER — ACETAMINOPHEN 500 MG PO TABS
1000.0000 mg | ORAL_TABLET | Freq: Three times a day (TID) | ORAL | Status: DC
  Administered 2014-04-01 – 2014-04-02 (×3): 1000 mg via ORAL
  Filled 2014-03-31 (×7): qty 2

## 2014-03-31 MED ORDER — HYDROMORPHONE HCL 1 MG/ML IJ SOLN
0.5000 mg | INTRAMUSCULAR | Status: DC | PRN
  Administered 2014-03-31: 1 mg via INTRAVENOUS
  Administered 2014-04-01: 0.5 mg via INTRAVENOUS
  Administered 2014-04-01: 1 mg via INTRAVENOUS
  Administered 2014-04-01 (×2): 0.5 mg via INTRAVENOUS
  Administered 2014-04-02: 1 mg via INTRAVENOUS
  Filled 2014-03-31 (×6): qty 1

## 2014-03-31 MED ORDER — OXYCODONE HCL 5 MG/5ML PO SOLN: 5.0000 mg | Freq: Once | ORAL | Status: DC | PRN

## 2014-03-31 MED ORDER — EPHEDRINE SULFATE 50 MG/ML IJ SOLN
INTRAMUSCULAR | Status: DC | PRN
  Administered 2014-03-31 (×2): 10 mg via INTRAVENOUS

## 2014-03-31 MED ORDER — SUCCINYLCHOLINE CHLORIDE 20 MG/ML IJ SOLN
INTRAMUSCULAR | Status: DC | PRN
  Administered 2014-03-31: 50 mg via INTRAVENOUS

## 2014-03-31 MED ORDER — SODIUM CHLORIDE 0.9 % IV SOLN
INTRAVENOUS | Status: DC
  Administered 2014-03-31: 15:00:00 via INTRAVENOUS
  Administered 2014-03-31: 500 mL via INTRAVENOUS

## 2014-03-31 MED ORDER — GLYCOPYRROLATE 0.2 MG/ML IJ SOLN
INTRAMUSCULAR | Status: DC | PRN
  Administered 2014-03-31: 0.4 mg via INTRAVENOUS

## 2014-03-31 MED ORDER — ACETAMINOPHEN 10 MG/ML IV SOLN
1000.0000 mg | Freq: Once | INTRAVENOUS | Status: AC
  Administered 2014-03-31: 1000 mg via INTRAVENOUS
  Filled 2014-03-31: qty 100

## 2014-03-31 MED ORDER — EPHEDRINE SULFATE 50 MG/ML IJ SOLN
INTRAMUSCULAR | Status: AC
  Filled 2014-03-31: qty 1

## 2014-03-31 MED ORDER — LACTATED RINGERS IV BOLUS (SEPSIS): 1000.0000 mL | Freq: Three times a day (TID) | INTRAVENOUS | Status: DC | PRN

## 2014-03-31 MED ORDER — MIDAZOLAM HCL 5 MG/5ML IJ SOLN
INTRAMUSCULAR | Status: DC | PRN
  Administered 2014-03-31: 2 mg via INTRAVENOUS

## 2014-03-31 MED ORDER — LIDOCAINE HCL (CARDIAC) 20 MG/ML IV SOLN
INTRAVENOUS | Status: AC
  Filled 2014-03-31: qty 5

## 2014-03-31 MED ORDER — HAEMOPHILUS B POLYSAC CONJ VAC IM SOLR
0.5000 mL | Freq: Once | INTRAMUSCULAR | Status: AC
  Administered 2014-03-31: 0.5 mL via INTRAMUSCULAR
  Filled 2014-03-31: qty 0.5

## 2014-03-31 MED ORDER — SUFENTANIL CITRATE 50 MCG/ML IV SOLN
INTRAVENOUS | Status: AC
  Filled 2014-03-31: qty 1

## 2014-03-31 MED ORDER — GLYCOPYRROLATE 0.2 MG/ML IJ SOLN
INTRAMUSCULAR | Status: AC
  Filled 2014-03-31: qty 2

## 2014-03-31 MED ORDER — PROMETHAZINE HCL 12.5 MG PO TABS
12.5000 mg | ORAL_TABLET | Freq: Four times a day (QID) | ORAL | Status: DC | PRN
  Filled 2014-03-31: qty 2

## 2014-03-31 MED ORDER — MENTHOL 3 MG MT LOZG
1.0000 | LOZENGE | OROMUCOSAL | Status: DC | PRN
  Administered 2014-03-31: 3 mg via ORAL
  Filled 2014-03-31: qty 9

## 2014-03-31 MED ORDER — HYDROMORPHONE HCL 1 MG/ML IJ SOLN: 0.2500 mg | INTRAMUSCULAR | Status: DC | PRN

## 2014-03-31 MED ORDER — MENINGOCOCCAL VAC A,C,Y,W-135 ~~LOC~~ INJ: 0.5000 mL | INJECTION | SUBCUTANEOUS | Status: DC | PRN

## 2014-03-31 MED ORDER — DIPHENHYDRAMINE HCL 50 MG/ML IJ SOLN: 12.5000 mg | Freq: Four times a day (QID) | INTRAMUSCULAR | Status: DC | PRN

## 2014-03-31 MED ORDER — PROMETHAZINE HCL 25 MG/ML IJ SOLN: 6.2500 mg | INTRAMUSCULAR | Status: DC | PRN

## 2014-03-31 MED ORDER — OXYCODONE HCL 5 MG PO TABS: 5.0000 mg | ORAL_TABLET | ORAL | Status: DC | PRN

## 2014-03-31 MED ORDER — LIP MEDEX EX OINT
1.0000 "application " | TOPICAL_OINTMENT | Freq: Two times a day (BID) | CUTANEOUS | Status: DC
  Administered 2014-03-31 – 2014-04-02 (×4): 1 via TOPICAL
  Filled 2014-03-31 (×2): qty 7

## 2014-03-31 MED ORDER — METOCLOPRAMIDE HCL 5 MG/ML IJ SOLN: 5.0000 mg | Freq: Four times a day (QID) | INTRAMUSCULAR | Status: DC | PRN

## 2014-03-31 MED ORDER — DEXAMETHASONE SODIUM PHOSPHATE 10 MG/ML IJ SOLN
INTRAMUSCULAR | Status: AC
  Filled 2014-03-31: qty 1

## 2014-03-31 MED ORDER — DIPHENHYDRAMINE HCL 25 MG PO CAPS: 25.0000 mg | ORAL_CAPSULE | Freq: Four times a day (QID) | ORAL | Status: DC | PRN

## 2014-03-31 SURGICAL SUPPLY — 62 items
ANCHOR TIS RET SYS 200 (MISCELLANEOUS) IMPLANT
APPLIER CLIP 5 13 M/L LIGAMAX5 (MISCELLANEOUS) ×2
APPLIER CLIP ROT 10 11.4 M/L (STAPLE)
APPLIER CLIP ROT 13.4 12 LRG (CLIP)
CHLORAPREP W/TINT 26ML (MISCELLANEOUS) ×2 IMPLANT
CLAMP ENDO BABCK 10MM (STAPLE) IMPLANT
CLIP APPLIE 5 13 M/L LIGAMAX5 (MISCELLANEOUS) ×1 IMPLANT
CLIP APPLIE ROT 10 11.4 M/L (STAPLE) IMPLANT
CLIP APPLIE ROT 13.4 12 LRG (CLIP) IMPLANT
CONNECTOR 5 IN 1 STRAIGHT STRL (MISCELLANEOUS) ×2 IMPLANT
DECANTER SPIKE VIAL GLASS SM (MISCELLANEOUS) ×2 IMPLANT
DISSECTOR BLUNT TIP ENDO 5MM (MISCELLANEOUS) IMPLANT
DRAPE LAPAROSCOPIC ABDOMINAL (DRAPES) ×2 IMPLANT
DRAPE WARM FLUID 44X44 (DRAPE) ×2 IMPLANT
DRSG OPSITE POSTOP 4X6 (GAUZE/BANDAGES/DRESSINGS) ×2 IMPLANT
DRSG TEGADERM 4X4.75 (GAUZE/BANDAGES/DRESSINGS) ×2 IMPLANT
DRSG TEGADERM 8X12 (GAUZE/BANDAGES/DRESSINGS) ×6 IMPLANT
GAUZE SPONGE 2X2 8PLY STRL LF (GAUZE/BANDAGES/DRESSINGS) ×1 IMPLANT
GLOVE BIO SURGEON STRL SZ 6.5 (GLOVE) ×4 IMPLANT
GLOVE BIOGEL PI IND STRL 6 (GLOVE) ×1 IMPLANT
GLOVE BIOGEL PI IND STRL 6.5 (GLOVE) ×2 IMPLANT
GLOVE BIOGEL PI IND STRL 7.0 (GLOVE) ×4 IMPLANT
GLOVE BIOGEL PI INDICATOR 6 (GLOVE) ×1
GLOVE BIOGEL PI INDICATOR 6.5 (GLOVE) ×2
GLOVE BIOGEL PI INDICATOR 7.0 (GLOVE) ×4
GLOVE ECLIPSE 8.0 STRL XLNG CF (GLOVE) ×2 IMPLANT
GLOVE INDICATOR 8.0 STRL GRN (GLOVE) ×4 IMPLANT
GLOVE SURG SS PI 6.5 STRL IVOR (GLOVE) ×2 IMPLANT
GOWN STRL REUS W/TWL LRG LVL3 (GOWN DISPOSABLE) ×4 IMPLANT
GOWN STRL REUS W/TWL XL LVL3 (GOWN DISPOSABLE) ×4 IMPLANT
HANDLE STAPLE EGIA 4 XL (STAPLE) IMPLANT
IV LACTATED RINGERS 1000ML (IV SOLUTION) ×2 IMPLANT
KIT BASIN OR (CUSTOM PROCEDURE TRAY) ×2 IMPLANT
NS IRRIG 1000ML POUR BTL (IV SOLUTION) ×4 IMPLANT
PENCIL BUTTON HOLSTER BLD 10FT (ELECTRODE) ×2 IMPLANT
POUCH ENDO CATCH II 15MM (MISCELLANEOUS) IMPLANT
POUCH SPECIMEN RETRIEVAL 10MM (ENDOMECHANICALS) IMPLANT
SCISSORS LAP 5X35 DISP (ENDOMECHANICALS) ×2 IMPLANT
SET IRRIG TUBING LAPAROSCOPIC (IRRIGATION / IRRIGATOR) ×2 IMPLANT
SHEARS HARMONIC ACE PLUS 36CM (ENDOMECHANICALS) ×2 IMPLANT
SLEEVE XCEL OPT CAN 5 100 (ENDOMECHANICALS) ×6 IMPLANT
SOLUTION ANTI FOG 6CC (MISCELLANEOUS) IMPLANT
SPONGE GAUZE 2X2 STER 10/PKG (GAUZE/BANDAGES/DRESSINGS) ×1
SPONGE LAP 18X18 X RAY DECT (DISPOSABLE) IMPLANT
STAPLER VISISTAT 35W (STAPLE) ×2 IMPLANT
SUT MNCRL AB 4-0 PS2 18 (SUTURE) ×4 IMPLANT
SUT PDS AB 0 CT1 36 (SUTURE) ×4 IMPLANT
SUT SILK 2 0 (SUTURE) ×1
SUT SILK 2-0 30XBRD TIE 12 (SUTURE) ×1 IMPLANT
TAPE CLOTH 4X10 WHT NS (GAUZE/BANDAGES/DRESSINGS) ×2 IMPLANT
TOWEL OR 17X26 10 PK STRL BLUE (TOWEL DISPOSABLE) ×2 IMPLANT
TRAY FOLEY CATH 14FRSI W/METER (CATHETERS) ×2 IMPLANT
TRAY LAPAROSCOPIC (CUSTOM PROCEDURE TRAY) ×2 IMPLANT
TROCAR BLADELESS OPT 5 75 (ENDOMECHANICALS) ×2 IMPLANT
TROCAR XCEL 12X100 BLDLESS (ENDOMECHANICALS) ×2 IMPLANT
TROCAR XCEL BLUNT TIP 100MML (ENDOMECHANICALS) IMPLANT
TROCAR XCEL NON-BLD 11X100MML (ENDOMECHANICALS) IMPLANT
TROCAR XCEL UNIV SLVE 11M 100M (ENDOMECHANICALS) IMPLANT
TUBING CONNECTING 10 (TUBING) ×2 IMPLANT
TUBING FILTER THERMOFLATOR (ELECTROSURGICAL) ×2 IMPLANT
WATER STERILE IRR 1500ML POUR (IV SOLUTION) ×2 IMPLANT
YANKAUER SUCT BULB TIP 10FT TU (MISCELLANEOUS) ×2 IMPLANT
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
## 2014-03-31 NOTE — Anesthesia Procedure Notes (Signed)
Procedure Name: Intubation Date/Time: 03/31/2014 3:30 PM Performed by: Danley Danker L Patient Re-evaluated:Patient Re-evaluated prior to inductionOxygen Delivery Method: Circle system utilized Preoxygenation: Pre-oxygenation with 100% oxygen Intubation Type: IV induction Ventilation: Mask ventilation without difficulty and Oral airway inserted - appropriate to patient size Laryngoscope Size: Sabra Heck and 2 Grade View: Grade I Tube type: Oral Tube size: 7.0 mm Number of attempts: 1 Airway Equipment and Method: Stylet Placement Confirmation: ETT inserted through vocal cords under direct vision,  breath sounds checked- equal and bilateral and positive ETCO2 Secured at: 20 cm Tube secured with: Tape Dental Injury: Teeth and Oropharynx as per pre-operative assessment

## 2014-03-31 NOTE — Anesthesia Preprocedure Evaluation (Addendum)
Anesthesia Evaluation  Patient identified by MRN, date of birth, ID band Patient awake    Reviewed: Allergy & Precautions, NPO status , Patient's Chart, lab work & pertinent test results  Airway Mallampati: III  TM Distance: >3 FB Neck ROM: Full    Dental  (+) Teeth Intact   Pulmonary Current Smoker,          Cardiovascular negative cardio ROS      Neuro/Psych  Headaches, Seizures -,  Anxiety Depression    GI/Hepatic Neg liver ROS, Splenic hemangioma   Endo/Other  negative endocrine ROS  Renal/GU negative Renal ROS     Musculoskeletal   Abdominal   Peds  Hematology  (+) anemia , Thrombocytopenia. Coagulopathy- INR 1.89. Low Fibrinogen   Anesthesia Other Findings   Reproductive/Obstetrics                            Anesthesia Physical Anesthesia Plan  ASA: III  Anesthesia Plan: General   Post-op Pain Management:    Induction: Intravenous  Airway Management Planned: Oral ETT  Additional Equipment:   Intra-op Plan:   Post-operative Plan: Extubation in OR  Informed Consent: I have reviewed the patients History and Physical, chart, labs and discussed the procedure including the risks, benefits and alternatives for the proposed anesthesia with the patient or authorized representative who has indicated his/her understanding and acceptance.   Dental advisory given  Plan Discussed with: CRNA and Surgeon  Anesthesia Plan Comments:        Anesthesia Quick Evaluation

## 2014-03-31 NOTE — Transfer of Care (Signed)
Immediate Anesthesia Transfer of Care Note  Patient: Mary Cooley  Procedure(s) Performed: Procedure(s): LAPAROSCOPIC SPLENECTOMY (N/A)  Patient Location: PACU  Anesthesia Type:General  Level of Consciousness: awake and oriented  Airway & Oxygen Therapy: Patient Spontanous Breathing and Patient connected to face mask oxygen  Post-op Assessment: Report given to RN and Post -op Vital signs reviewed and stable  Post vital signs: Reviewed and stable  Last Vitals:  Filed Vitals:   03/31/14 1440  BP: 124/74  Pulse: 80  Temp: 37.2 C  Resp: 18    Complications: No apparent anesthesia complications

## 2014-03-31 NOTE — Anesthesia Postprocedure Evaluation (Signed)
  Anesthesia Post-op Note  Patient: Mary Cooley  Procedure(s) Performed: Procedure(s): LAPAROSCOPIC SPLENECTOMY (N/A)  Patient Location: PACU  Anesthesia Type:General  Level of Consciousness: awake and alert   Airway and Oxygen Therapy: Patient Spontanous Breathing  Post-op Pain: none  Post-op Assessment: Post-op Vital signs reviewed  Post-op Vital Signs: Reviewed  Last Vitals:  Filed Vitals:   03/31/14 1917  BP:   Pulse: 78  Temp: 36.5 C  Resp: 20    Complications: No apparent anesthesia complications

## 2014-03-31 NOTE — Discharge Instructions (Signed)
LAPAROSCOPIC SURGERY: POST OP INSTRUCTIONS ° °1. DIET: Follow a light bland diet the first 24 hours after arrival home, such as soup, liquids, crackers, etc.  Be sure to include lots of fluids daily.  Avoid fast food or heavy meals as your are more likely to get nauseated.  Eat a low fat the next few days after surgery.   °2. Take your usually prescribed home medications unless otherwise directed. °3. PAIN CONTROL: °a. Pain is best controlled by a usual combination of three different methods TOGETHER: °i. Ice/Heat °ii. Over the counter pain medication °iii. Prescription pain medication °b. Most patients will experience some swelling and bruising around the incisions.  Ice packs or heating pads (30-60 minutes up to 6 times a day) will help. Use ice for the first few days to help decrease swelling and bruising, then switch to heat to help relax tight/sore spots and speed recovery.  Some people prefer to use ice alone, heat alone, alternating between ice & heat.  Experiment to what works for you.  Swelling and bruising can take several weeks to resolve.   °c. It is helpful to take an over-the-counter pain medication regularly for the first few weeks.  Choose one of the following that works best for you: °i. Naproxen (Aleve, etc)  Two 220mg tabs twice a day °ii. Ibuprofen (Advil, etc) Three 200mg tabs four times a day (every meal & bedtime) °iii. Acetaminophen (Tylenol, etc) 500-650mg four times a day (every meal & bedtime) °d. A  prescription for pain medication (such as oxycodone, hydrocodone, etc) should be given to you upon discharge.  Take your pain medication as prescribed.  °i. If you are having problems/concerns with the prescription medicine (does not control pain, nausea, vomiting, rash, itching, etc), please call us [phone] to see if we need to switch you to a different pain medicine that will work better for you and/or control your side effect better. °ii. If you need a refill on your pain medication,  please contact your pharmacy.  They will contact our office to request authorization. Prescriptions will not be filled after 5 pm or on week-ends. °4. Avoid getting constipated.  Between the surgery and the pain medications, it is common to experience some constipation.  Increasing fluid intake and taking a fiber supplement (such as Metamucil, Citrucel, FiberCon, MiraLax, etc) 1-2 times a day regularly will usually help prevent this problem from occurring.  A mild laxative (prune juice, Milk of Magnesia, MiraLax, etc) should be taken according to package directions if there are no bowel movements after 48 hours.   °5. Watch out for diarrhea.  If you have many loose bowel movements, simplify your diet to bland foods & liquids for a few days.  Stop any stool softeners and decrease your fiber supplement.  Switching to mild anti-diarrheal medications (Kayopectate, Pepto Bismol) can help.  If this worsens or does not improve, please call us. °6. Wash / shower every day.  You may shower over the dressings as they are waterproof.  Continue to shower over incision(s) after the dressing is off. °7. Remove your waterproof bandages 5 days after surgery.  You may leave the incision open to air.  You may replace a dressing/Band-Aid to cover the incision for comfort if you wish.  °8. ACTIVITIES as tolerated:   °a. You may resume regular (light) daily activities beginning the next day--such as daily self-care, walking, climbing stairs--gradually increasing activities as tolerated.  If you can walk 30 minutes without difficulty, it   is safe to try more intense activity such as jogging, treadmill, bicycling, low-impact aerobics, swimming, etc. b. Save the most intensive and strenuous activity for last such as sit-ups, heavy lifting, contact sports, etc  Refrain from any heavy lifting or straining until you are off narcotics for pain control.   c. DO NOT PUSH THROUGH PAIN.  Let pain be your guide: If it hurts to do something, don't  do it.  Pain is your body warning you to avoid that activity for another week until the pain goes down. d. You may drive when you are no longer taking prescription pain medication, you can comfortably wear a seatbelt, and you can safely maneuver your car and apply brakes. e. Dennis Bast may have sexual intercourse when it is comfortable.  9. FOLLOW UP in our office a. Please call [facility] at [phone] to set up an appointment to see your surgeon in the office for a follow-up appointment approximately 2-3 weeks after your surgery. b. Make sure that you call for this appointment the day you arrive home to insure a convenient appointment time. 10. IF YOU HAVE DISABILITY OR FAMILY LEAVE FORMS, BRING THEM TO THE OFFICE FOR PROCESSING.  DO NOT GIVE THEM TO YOUR DOCTOR.   WHEN TO CALL us [phone]: 1. Poor pain control 2. Reactions / problems with new medications (rash/itching, nausea, etc)  3. Fever over 101.5 F (38.5 C) 4. Inability to urinate 5. Nausea and/or vomiting 6. Worsening swelling or bruising 7. Continued bleeding from incision. 8. Increased pain, redness, or drainage from the incision   The clinic staff is available to answer your questions during regular business hours (8:30am-5pm).  Please dont hesitate to call and ask to speak to one of our nurses for clinical concerns.   If you have a medical emergency, go to the nearest emergency room or call 911.  A surgeon from [name] is always on call at the [facility], [place], [place], [place], [place]  [address] ? MAIN: [phone] ? TOLL FREE: [phone] ?  FAX [phone] [email or web address]  Managing Pain  Pain after surgery or related to activity is often due to strain/injury to muscle, tendon, nerves and/or incisions.  This pain is usually short-term and will improve in a few months.   Many people find it helpful to do the following things TOGETHER  to help speed the process of healing and to get back to regular activity more quickly:  1. Avoid heavy physical activity a.  no lifting greater than 20 pounds b. Do not push through the pain.  Listen to your body and avoid positions and maneuvers than reproduce the pain c. Walking is okay as tolerated, but go slowly and stop when getting sore.  d. Remember: If it hurts to do it, then dont do it! 2. Take Anti-inflammatory medication  a. Take with food/snack around the clock for 1-2 weeks i. This helps the muscle and nerve tissues become less irritable and calm down faster ii. Choose Acetaminophen 572m tabs (Tylenol) 1-2 pills with every meal and just before bedtime 3. Use a Heating pad or Ice/Cold Pack a. 4-6 times a day b. May use warm bath/hottub  or showers 4. Try Gentle Massage and/or Stretching  a. at the area of pain many times a day b. stop if you feel pain - do not overdo it  Try these steps together to help you body heal faster and avoid making things  get worse.  Doing just one of these things may not be enough.    If you are not getting better after two weeks or are noticing you are getting worse, contact our office for further advice; we may need to re-evaluate you & see what other things we can do to help.

## 2014-03-31 NOTE — Op Note (Signed)
03/31/2014  5:35 PM  PATIENT:  Mary Cooley  29 y.o. 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 Also mild/moderate thrombocytopenia. Has had worsening abdominal pain. Had a CAT scan which revealed 18 cm vascular mass on spleen suspicious for giant hemangioma. Given the large size with risk of sarcoma transformation in the fact that she has a bleeding disorder that cannot tolerate any spontaneous rupture, surgical consultation requested.   The anatomy and physiology of the spleen was discussed.  Pathophysiology of the disease was discussed.  Options were discussed, and I made a recommendation to remove the spleen to help treat the pathology.  Minimally invasive & open techniques discussed.  Risks of bleeding, infection, injury to other organs, reoperation, death, and other  risks were discussed.   I noted a good likelihood this will help address the problem.  While there are risks, I feel the risks of nonoperative management are greater; therefore, I feel surgery offers the best option. Educational material was available.  We will work to minimize complications.  Patient was supposed to get immunizations done preoperatively but due to financial and transportation issues, not able to do it.  OR FINDINGS: Left upper quadrant replace with large spleen.  2 dominant splenic arteries and 1 dominant splenic vein.  No evidence of lymphadenopathy.  DESCRIPTION:   Informed consent was confirmed.  Patient received 1 unit of cryoprecipitate just prior to surgery.  She received the second unit of cryoprecipitate during the surgery.  She underwent anesthesia without any difficulty.  She was positioned partial right-side-down decubitus with beanbag to a 45 angle.  Axillary roll placed.  Left upper extremity held over left side in natural positioning.  Chest and abdomen prepped and draped in a sterile fashion.  Surgical timeout confirmed our plan.  I placed a 5 mm port through an infraumbilical curvilinear incision using a modified Hasson cutdown technique.  We induced carbon dioxide insufflation.  Entry was clean.  There were no adhesions.  She had an obvious enlarged spleen with intrasplenic superiomedial mass noted.  I placed 5 ports in the right side and right upper quadrant.  One was placed a left lower quadrant.  I freed omental adhesions off the inferior tip of the spleen to allow the colon to fall down.  It was already rather redundant and falling down.  I was able to find the short gastrics along the greater curvature of the stomach which had been pushed midline.  Carefully freed those off using a slow ultrasonic dissection to good result.  Came up over the left crus to the apex of the spleen and the mass.  I then began to roll the inferior half of the spleen off the  retroperitoneum and freed off the left kidney and adrenal gland.  I could see and isolate the inferior splenic artery.  I placed 3 clips proximally on it and a clip on the specimen side.  Transected with 2 firings of Harmonic in between.  I then placed a GelPort through an upper midline incision given the giant size of the mass and need for removing it intact and not morcellating it.  With that I can more safely roll the spleen over and mobilize it in a lateral to medial fashion going along the left lateral retroperitoneum of the posterior diaphragm until I came over to the left crus.  No anterior or superior diaphragmatic adhesions of major significance.  With that I could roll the spleen over to the right upper quadrant.  I could isolate the remaining 2 splenic arteries.  Those were narrow enough to be taken with clips proximally and distally and with Harmonic in between.  She had a single dominant splenic vein.  This was able to be controlled with 4 clips proximally 2 clips distally and again Harmonic in between.  That allowed complete mobilization of the spleen.  We did irrigation made sure there was good hemostasis.  I ended up having to extend the midline incision to 15 cm to completely extract the mass intact.  Was about size of a truncated football.  Did careful meticulous inspection of the retroperitoneum of the left upper quadrant.  I isolated & clipped a few side branch retroperitoneal veins.  The splenic arterial & venous pedicles were further clamped and ligated with 0 silk suture to good result.    We did copious irrigation and bilateral upper quadrants and on pelvis. Had good hemostasis.  Carefully inspected make sure hemostasis was good.  Ports were removed.  I closed the midline incision using 0 PDS running suture.  Port sites and midline skin closed with 4 Monocryl suture.  Sterile dressings applied.  Patient being x-ray to go to recovery room.  I am about to try and locate her common-law  husband and family.  She will stay at least overnight, possibly longer given her coagulation disorder.  Adin Hector, M.D., F.A.C.S. Gastrointestinal and Minimally Invasive Surgery [facility] [address] [place], [place] [address] [phone] Main / Paging

## 2014-04-01 ENCOUNTER — Telehealth: Payer: Self-pay | Admitting: Hematology and Oncology

## 2014-04-01 ENCOUNTER — Other Ambulatory Visit: Payer: Self-pay | Admitting: Hematology and Oncology

## 2014-04-01 ENCOUNTER — Encounter (HOSPITAL_COMMUNITY): Payer: Self-pay | Admitting: *Deleted

## 2014-04-01 DIAGNOSIS — D739 Disease of spleen, unspecified: Secondary | ICD-10-CM

## 2014-04-01 DIAGNOSIS — D6489 Other specified anemias: Secondary | ICD-10-CM

## 2014-04-01 DIAGNOSIS — D61818 Other pancytopenia: Secondary | ICD-10-CM

## 2014-04-01 DIAGNOSIS — D696 Thrombocytopenia, unspecified: Secondary | ICD-10-CM

## 2014-04-01 DIAGNOSIS — D682 Hereditary deficiency of other clotting factors: Secondary | ICD-10-CM

## 2014-04-01 DIAGNOSIS — D1809 Hemangioma of other sites: Secondary | ICD-10-CM | POA: Diagnosis not present

## 2014-04-01 DIAGNOSIS — D72829 Elevated white blood cell count, unspecified: Secondary | ICD-10-CM

## 2014-04-01 LAB — CBC
HEMATOCRIT: 22.4 % — AB (ref 36.0–46.0)
Hemoglobin: 7.5 g/dL — ABNORMAL LOW (ref 12.0–15.0)
MCH: 30.9 pg (ref 26.0–34.0)
MCHC: 33.5 g/dL (ref 30.0–36.0)
MCV: 92.2 fL (ref 78.0–100.0)
Platelets: 126 10*3/uL — ABNORMAL LOW (ref 150–400)
RBC: 2.43 MIL/uL — AB (ref 3.87–5.11)
RDW: 13.9 % (ref 11.5–15.5)
WBC: 12.6 10*3/uL — ABNORMAL HIGH (ref 4.0–10.5)

## 2014-04-01 LAB — PREPARE CRYOPRECIPITATE
UNIT DIVISION: 0
Unit division: 0

## 2014-04-01 LAB — BASIC METABOLIC PANEL
Anion gap: 9 (ref 5–15)
BUN: 8 mg/dL (ref 6–23)
CHLORIDE: 98 mmol/L (ref 96–112)
CO2: 27 mmol/L (ref 19–32)
Calcium: 9.4 mg/dL (ref 8.4–10.5)
Creatinine, Ser: 0.52 mg/dL (ref 0.50–1.10)
GFR calc non Af Amer: >90 mL/min (ref >90)
Glucose, Bld: 100 mg/dL — ABNORMAL HIGH (ref 70–99)
Potassium: 4.9 mmol/L (ref 3.5–5.1)
Sodium: 134 mmol/L — ABNORMAL LOW (ref 135–145)

## 2014-04-01 LAB — PREPARE RBC (CROSSMATCH)

## 2014-04-01 MED ORDER — PNEUMOCOCCAL VAC POLYVALENT 25 MCG/0.5ML IJ INJ
0.5000 mL | INJECTION | Freq: Once | INTRAMUSCULAR | Status: AC
  Administered 2014-04-01: 0.5 mL via INTRAMUSCULAR
  Filled 2014-04-01: qty 0.5

## 2014-04-01 MED ORDER — SODIUM CHLORIDE 0.9 % IV SOLN
Freq: Once | INTRAVENOUS | Status: AC
  Administered 2014-04-01: 09:00:00 via INTRAVENOUS

## 2014-04-01 MED ORDER — MENINGOCOCCAL A C Y&W-135 OLIG IM SOLR
0.5000 mL | Freq: Once | INTRAMUSCULAR | Status: AC
  Administered 2014-04-01: 0.5 mL via INTRAMUSCULAR
  Filled 2014-04-01: qty 0.5

## 2014-04-01 MED ORDER — MENINGOCOCCAL VAC A,C,Y,W-135 ~~LOC~~ INJ
0.5000 mL | INJECTION | Freq: Once | SUBCUTANEOUS | Status: DC
  Filled 2014-04-01: qty 0.5

## 2014-04-01 MED ORDER — BOOST / RESOURCE BREEZE PO LIQD
1.0000 | Freq: Two times a day (BID) | ORAL | Status: DC
  Administered 2014-04-01: 1 via ORAL

## 2014-04-01 NOTE — Telephone Encounter (Signed)
s.w. pt husband and advised on Feb appt...ok and aware

## 2014-04-01 NOTE — Progress Notes (Signed)
Marshay Slates   DOB:05/15/1985   SW#:[id number]    This patient was found to have abnormal CBC from routine blood work monitoring by her primary care provider in May of 2015. At that time, her white blood cell count was 3.4, hemoglobin 10.4, platelet count 78,000 with elevated INR. This patient had prior history of posttraumatic stress disorder, depression, and anorexia nervosa. She had weight loss recently. She complained of intermittent bleeding from her stool. She also has extensive bruising throughout. She denies recent epistaxis, hematuria, or heavy menstruation. In fact, she has not had a period for a long time. The patient denies history of liver disease, exposure to heparin, history of cardiac murmur/prior cardiovascular surgery or recent new medications She denies prior blood or platelet transfusions. On 10/25/2013, CT scan of the abdomen and pelvis revealed a large splenic mass, suspicious for hemangioma. Surgery was recommended and she was referred to general surgery for splenectomy Subjective: The patient is currently postoperative day 1. She denies pain. No nausea or vomiting. She was given perioperative cryoprecipitate transfusion. The patient denies any recent signs or symptoms of bleeding such as spontaneous epistaxis, hematuria or hematochezia.   Objective:  Filed Vitals:   04/01/14 0540  BP: 112/65  Pulse: 69  Temp: 98.2 F (36.8 C)  Resp: 18     Intake/Output Summary (Last 24 hours) at 04/01/14 0834 Last data filed at 04/01/14 0600  Gross per 24 hour  Intake 4509.25 ml  Output   1350 ml  Net 3159.25 ml    GENERAL:alert, no distress and comfortable SKIN: skin color is pale, texture, turgor are normal, no rashes or significant lesions. Mild bruising is noted EYES: normal, Conjunctiva are pale and non-injected, sclera clear OROPHARYNX:no exudate, no erythema and lips, buccal mucosa, and tongue normal  NECK: supple, thyroid normal size, non-tender, without  nodularity LYMPH:  no palpable lymphadenopathy in the cervical, axillary or inguinal LUNGS: clear to auscultation and percussion with normal breathing effort HEART: regular rate & rhythm and no murmurs and no lower extremity edema ABDOMEN:abdomen soft, non-tender and normal bowel sounds. Well-healed surgical scar Musculoskeletal:no cyanosis of digits and no clubbing  NEURO: alert & oriented x 3 with fluent speech, no focal motor/sensory deficits   Labs:  Lab Results  Component Value Date   WBC 12.6* 04/01/2014   HGB 7.5* 04/01/2014   HCT 22.4* 04/01/2014   MCV 92.2 04/01/2014   PLT 126* 04/01/2014   NEUTROABS 2.9 10/25/2013    Lab Results  Component Value Date   NA 134* 04/01/2014   K 4.9 04/01/2014   CL 98 04/01/2014   CO2 27 04/01/2014   Operative report was noted Assessment & Plan:   #1 splenic mass status post postsplenectomy I will get her splenectomy specimen reviewed at the next hematology tumor board. She would need splenectomy vaccination program  #2 mild leukocytosis This is related to postsplenectomy site. Monitor only  #3 postoperative anemia Due to high risk of bleeding, I recommend 1 unit of blood transfusion. We discussed some of the risks, benefits, and alternatives of blood transfusions. The patient is symptomatic from anemia and the hemoglobin level is critically low.  Some of the side-effects to be expected including risks of transfusion reactions, chills, infection, syndrome of volume overload and risk of hospitalization from various reasons and the patient is willing to proceed and went ahead to sign consent today.  #4 mild thrombocytopenia This is likely due to consumption from recent surgery. Monitor only  #5 bleeding disorder with low  fibrinogen level I recommend 1 unit of cryoprecipitate today postoperative to reduce risk of delayed postoperative bleeding. She agreed to proceed  #6 discharge planning She can probably be discharged later today  after transfusion. I will arrange outpatient appointment next week. Plan above is discussed with primary service.   [facility], [place], MD 04/01/2014  8:34 AM

## 2014-04-01 NOTE — Progress Notes (Signed)
[facility]  [address] [place], [place] [address] Phone: [phone] FAX: [phone]    Mary Cooley [id number] 04/02/1985  CARE TEAM:  PCP: Benito Mccreedy, MD  Outpatient Care Team: Patient Care Team: Benito Mccreedy, MD as PCP - General (Internal Medicine) Michael Boston, MD as Consulting Physician (General Surgery) Heath Lark, MD as Consulting Physician (Hematology)  Inpatient Treatment Team: Treatment Team: Attending Provider: Michael Boston, MD; Consulting Physician: Heath Lark, MD; Registered Nurse: Kristopher Oppenheim, RN   Subjective:  Sore Tired Tearful RN & husband in room Not getting up much  Objective:  Vital signs:  Filed Vitals:   04/01/14 1145 04/01/14 1215 04/01/14 1230 04/01/14 1245  BP: 116/77 120/82 119/78 117/78  Pulse: 72 71 83 80  Temp: 98.7 F (37.1 C) 97.8 F (36.6 C) 98.2 F (36.8 C) 98.1 F (36.7 C)  TempSrc: Oral Oral Oral Oral  Resp: $Remo'16 12 12 14  'DeKTv$ Height:      Weight:      SpO2: 100% 100% 100% 100%    Last BM Date: 03/30/14  Intake/Output   Yesterday:  01/28 0701 - 01/29 0700 In: 4509.3 [P.O.:960; I.V.:3356.3; Blood:193] Out: 1350 [Urine:1150; Blood:200] This shift:  Total I/O In: 685 [P.O.:360; Blood:325] Out: 150 [Urine:150]  Bowel function:  Flatus: y  BM: n  Drain: n/a  Physical Exam:  General: Pt awake/alert/oriented x4 in mild acute distress Eyes: PERRL, normal EOM.  Sclera clear.  No icterus Neuro: CN II-XII intact w/o focal sensory/motor deficits. Lymph: No head/neck/groin lymphadenopathy Psych:  No delerium/psychosis/paranoia.  A little sleepy & tearful but consolable HENT: Normocephalic, Mucus membranes moist.  No thrush Neck: Supple, No tracheal deviation Chest: No chest wall pain w good excursion CV:  Pulses intact.  Regular rhythm MS: Normal AROM mjr joints.  No obvious deformity Abdomen: Soft.  Nondistended.  Mildly tender at incisions only.   No evidence of peritonitis.  No incarcerated hernias. Ext:  SCDs BLE.  No mjr edema.  No cyanosis Skin: No petechiae / purpura.  Old bruises stable.  No hematoma at incision   Problem List:   Active Problems:   Splenic mass   Assessment  Mary Cooley  29 y.o. female  1 Day Post-Op  Procedure(s): LAPAROSCOPIC SPLENECTOMY  Sore  Plan:  -increase pain control -1 Unit cryo & 1 U PRBCs per Dr Alvy Bimler - d/w her -VTE prophylaxis- SCDs, etc - not a mjr issue with hypocoag state -mobilize as tolerated to help recovery  D/C patient from hospital when patient meets criteria (anticipate in 1-2 day(s)):  Tolerating oral intake well Ambulating in walkways Adequate pain control without IV medications Urinating  Having flatus   Mary Cooley, M.D., F.A.C.S. Gastrointestinal and Minimally Invasive Surgery [facility] [address] [place], [place] [address] [phone] Main / Paging   04/01/2014   Results:   Labs: Results for orders placed or performed during the hospital encounter of 03/31/14 (from the past 48 hour(s))  Protime-INR     Status: Abnormal   Collection Time: 03/31/14  8:47 AM  Result Value Ref Range   Prothrombin Time 21.9 (H) 11.6 - 15.2 seconds   INR 1.89 (H) 0.00 - 1.49  Prepare cryoprecipitate     Status: None   Collection Time: 03/31/14  9:00 AM  Result Value Ref Range   Unit Number [id number]    Blood Component Type CRYPOOL THAW    Unit division 00    Status of  Unit ISSUED,FINAL    Transfusion Status OK TO TRANSFUSE    Unit Number [id number]    Blood Component Type CRYPOOL THAW    Unit division 00    Status of Unit ISSUED,FINAL    Transfusion Status OK TO TRANSFUSE   Prepare RBC     Status: None   Collection Time: 03/31/14  3:53 PM  Result Value Ref Range   Order Confirmation ORDER PROCESSED BY BLOOD BANK   Basic metabolic panel     Status: Abnormal   Collection Time: 04/01/14  4:58 AM  Result  Value Ref Range   Sodium 134 (L) 135 - 145 mmol/L   Potassium 4.9 3.5 - 5.1 mmol/L   Chloride 98 96 - 112 mmol/L   CO2 27 19 - 32 mmol/L   Glucose, Bld 100 (H) 70 - 99 mg/dL   BUN 8 6 - 23 mg/dL   Creatinine, Ser 0.52 0.50 - 1.10 mg/dL   Calcium 9.4 8.4 - 10.5 mg/dL   GFR calc non Af Amer >90 >90 mL/min   GFR calc Af Amer >90 >90 mL/min    Comment: (NOTE) The eGFR has been calculated using the CKD EPI equation. This calculation has not been validated in all clinical situations. eGFR's persistently <90 mL/min signify possible Chronic Kidney Disease.    Anion gap 9 5 - 15  CBC     Status: Abnormal   Collection Time: 04/01/14  4:58 AM  Result Value Ref Range   WBC 12.6 (H) 4.0 - 10.5 K/uL   RBC 2.43 (L) 3.87 - 5.11 MIL/uL   Hemoglobin 7.5 (L) 12.0 - 15.0 g/dL   HCT 22.4 (L) 36.0 - 46.0 %   MCV 92.2 78.0 - 100.0 fL   MCH 30.9 26.0 - 34.0 pg   MCHC 33.5 30.0 - 36.0 g/dL   RDW 13.9 11.5 - 15.5 %   Platelets 126 (L) 150 - 400 K/uL  Prepare RBC     Status: None   Collection Time: 04/01/14  8:30 AM  Result Value Ref Range   Order Confirmation ORDER PROCESSED BY BLOOD BANK   Prepare cryoprecipitate     Status: None (Preliminary result)   Collection Time: 04/01/14  8:30 AM  Result Value Ref Range   Unit Number [id number]    Blood Component Type CRYPOOL THAW    Unit division 00    Status of Unit ISSUED    Transfusion Status OK TO TRANSFUSE     Imaging / Studies: No results found.  Medications / Allergies: per chart  Antibiotics: Anti-infectives    Start     Dose/Rate Route Frequency Ordered Stop   03/31/14 0824  clindamycin (CLEOCIN) IVPB 600 mg    Comments:  Pharmacy may adjust dosing strength, interval, or rate of medication as needed for optimal therapy for the patient Send with patient on call to the OR.  Anesthesia to complete antibiotic administration <9min prior to incision per [facility].   600 mg100 mL/hr over 30 Minutes Intravenous On call to O.R. 03/31/14  6606 03/31/14 1547   03/31/14 0600  gentamicin (GARAMYCIN) 260 mg in dextrose 5 % 100 mL IVPB     5 mg/kg  51.8 kg106.5 mL/hr over 60 Minutes Intravenous On call to O.R. 03/30/14 1633 03/31/14 1620       Note: Portions of this report may have been transcribed using voice recognition software. Every effort was made to ensure accuracy; however, inadvertent computerized transcription errors may be present.   Any transcriptional  errors that result from this process are unintentional.

## 2014-04-01 NOTE — Progress Notes (Signed)
Pt attempted to void twice and was unable, per order I & O cath 800 cc urine.
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
## 2014-04-01 NOTE — Progress Notes (Signed)
UR completed 

## 2014-04-02 DIAGNOSIS — D1809 Hemangioma of other sites: Secondary | ICD-10-CM | POA: Diagnosis not present

## 2014-04-02 LAB — CBC
HCT: 26.6 % — ABNORMAL LOW (ref 36.0–46.0)
Hemoglobin: 8.8 g/dL — ABNORMAL LOW (ref 12.0–15.0)
MCH: 30.2 pg (ref 26.0–34.0)
MCHC: 33.1 g/dL (ref 30.0–36.0)
MCV: 91.4 fL (ref 78.0–100.0)
Platelets: 166 10*3/uL (ref 150–400)
RBC: 2.91 MIL/uL — AB (ref 3.87–5.11)
RDW: 14.4 % (ref 11.5–15.5)
WBC: 4.3 10*3/uL (ref 4.0–10.5)

## 2014-04-02 LAB — PREPARE CRYOPRECIPITATE: UNIT DIVISION: 0

## 2014-04-02 MED ORDER — SODIUM CHLORIDE 0.9 % IV SOLN: 250.0000 mL | INTRAVENOUS | Status: DC | PRN

## 2014-04-02 MED ORDER — SODIUM CHLORIDE 0.9 % IJ SOLN: 3.0000 mL | Freq: Two times a day (BID) | INTRAMUSCULAR | Status: DC

## 2014-04-02 MED ORDER — SODIUM CHLORIDE 0.9 % IJ SOLN: 3.0000 mL | INTRAMUSCULAR | Status: DC | PRN
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
## 2014-04-02 NOTE — Progress Notes (Signed)
Pt stated she took medications from home this morning since she knew she was being discharged today.  Pt educated it is not safe when in the hospital to take personal medication, only take the medications we offer as we are monitoring for interactions and side effects.  Pt agreed, no questions at this time.   Newt Minion

## 2014-04-02 NOTE — Progress Notes (Signed)
Reviewed discharge instructions with pt, family at bedside.  Taxi Voucher and paper RX given to pt. Pt family called taxi for pickup. No question at this time.   Newt Minion

## 2014-04-04 ENCOUNTER — Ambulatory Visit (HOSPITAL_COMMUNITY)
Admission: RE | Admit: 2014-04-04 | Discharge: 2014-04-04 | Disposition: A | Discharge status: Home / Self Care | Payer: Medicaid Other | Source: Ambulatory Visit | Attending: Hematology and Oncology | Admitting: Hematology and Oncology

## 2014-04-04 LAB — TYPE AND SCREEN
ABO/RH(D): A POS
ANTIBODY SCREEN: NEGATIVE
Unit division: 0
Unit division: 0

## 2014-04-07 ENCOUNTER — Other Ambulatory Visit (HOSPITAL_BASED_OUTPATIENT_CLINIC_OR_DEPARTMENT_OTHER): Payer: Medicaid Other

## 2014-04-07 ENCOUNTER — Ambulatory Visit (HOSPITAL_BASED_OUTPATIENT_CLINIC_OR_DEPARTMENT_OTHER): Payer: Medicaid Other | Admitting: Hematology and Oncology

## 2014-04-07 ENCOUNTER — Encounter: Payer: Self-pay | Admitting: Hematology and Oncology

## 2014-04-07 ENCOUNTER — Telehealth: Payer: Self-pay | Admitting: Hematology and Oncology

## 2014-04-07 VITALS — BP 110/72 | HR 91 | Temp 97.8°F | Resp 18 | Ht 69.0 in | Wt 109.2 lb

## 2014-04-07 DIAGNOSIS — Z9081 Acquired absence of spleen: Secondary | ICD-10-CM

## 2014-04-07 DIAGNOSIS — D61818 Other pancytopenia: Secondary | ICD-10-CM

## 2014-04-07 DIAGNOSIS — D638 Anemia in other chronic diseases classified elsewhere: Secondary | ICD-10-CM | POA: Diagnosis not present

## 2014-04-07 DIAGNOSIS — T148 Other injury of unspecified body region: Secondary | ICD-10-CM | POA: Diagnosis not present

## 2014-04-07 DIAGNOSIS — R161 Splenomegaly, not elsewhere classified: Secondary | ICD-10-CM | POA: Diagnosis not present

## 2014-04-07 DIAGNOSIS — D473 Essential (hemorrhagic) thrombocythemia: Secondary | ICD-10-CM | POA: Diagnosis not present

## 2014-04-07 DIAGNOSIS — T148XXA Other injury of unspecified body region, initial encounter: Secondary | ICD-10-CM

## 2014-04-07 DIAGNOSIS — D682 Hereditary deficiency of other clotting factors: Secondary | ICD-10-CM

## 2014-04-07 DIAGNOSIS — R7989 Other specified abnormal findings of blood chemistry: Secondary | ICD-10-CM

## 2014-04-07 LAB — CBC & DIFF AND RETIC
BASO%: 0.8 % (ref 0.0–2.0)
Basophils Absolute: 0 10*3/uL (ref 0.0–0.1)
EOS ABS: 0.1 10*3/uL (ref 0.0–0.5)
EOS%: 2.5 % (ref 0.0–7.0)
HCT: 30.5 % — ABNORMAL LOW (ref 34.8–46.6)
HEMOGLOBIN: 10.1 g/dL — AB (ref 11.6–15.9)
Immature Retic Fract: 5 % (ref 1.60–10.00)
LYMPH#: 1 10*3/uL (ref 0.9–3.3)
LYMPH%: 20.6 % (ref 14.0–49.7)
MCH: 30.1 pg (ref 25.1–34.0)
MCHC: 33.1 g/dL (ref 31.5–36.0)
MCV: 91 fL (ref 79.5–101.0)
MONO#: 0.6 10*3/uL (ref 0.1–0.9)
MONO%: 12 % (ref 0.0–14.0)
NEUT#: 3 10*3/uL (ref 1.5–6.5)
NEUT%: 64.1 % (ref 38.4–76.8)
Platelets: 707 10*3/uL — ABNORMAL HIGH (ref 145–400)
RBC: 3.35 10*6/uL — AB (ref 3.70–5.45)
RDW: 13.4 % (ref 11.2–14.5)
RETIC %: 1.14 % (ref 0.70–2.10)
RETIC CT ABS: 38.19 10*3/uL (ref 33.70–90.70)
WBC: 4.8 10*3/uL (ref 3.9–10.3)

## 2014-04-07 LAB — COMPREHENSIVE METABOLIC PANEL (CC13)
ALT: 31 U/L (ref 0–55)
AST: 32 U/L (ref 5–34)
Albumin: 4.6 g/dL (ref 3.5–5.0)
Alkaline Phosphatase: 70 U/L (ref 40–150)
Anion Gap: 11 mEq/L (ref 3–11)
BUN: 11.3 mg/dL (ref 7.0–26.0)
CHLORIDE: 97 meq/L — AB (ref 98–109)
CO2: 26 mEq/L (ref 22–29)
Calcium: 9.8 mg/dL (ref 8.4–10.4)
Creatinine: 0.7 mg/dL (ref 0.6–1.1)
Glucose: 92 mg/dl (ref 70–140)
POTASSIUM: 3.9 meq/L (ref 3.5–5.1)
Sodium: 133 mEq/L — ABNORMAL LOW (ref 136–145)
Total Bilirubin: 0.49 mg/dL (ref 0.20–1.20)
Total Protein: 7.9 g/dL (ref 6.4–8.3)

## 2014-04-07 LAB — HOLD TUBE, BLOOD BANK

## 2014-04-07 LAB — LACTATE DEHYDROGENASE (CC13): LDH: 235 U/L (ref 125–245)

## 2014-04-07 NOTE — Telephone Encounter (Signed)
gave pt avs report and appt for march.

## 2014-04-08 DIAGNOSIS — D638 Anemia in other chronic diseases classified elsewhere: Secondary | ICD-10-CM | POA: Insufficient documentation

## 2014-04-08 DIAGNOSIS — Z9081 Acquired absence of spleen: Secondary | ICD-10-CM | POA: Insufficient documentation

## 2014-04-08 DIAGNOSIS — R7989 Other specified abnormal findings of blood chemistry: Secondary | ICD-10-CM

## 2014-04-08 DIAGNOSIS — D75838 Other thrombocytosis: Secondary | ICD-10-CM | POA: Insufficient documentation

## 2014-04-08 LAB — FIBRINOGEN: Fibrinogen: 417 mg/dL (ref 204–475)

## 2014-04-08 NOTE — Progress Notes (Signed)
[place], [place] SUMMARY OF HEMATOLOGIC HISTORY:  She was found to have abnormal CBC from routine blood work monitoring by her primary care provider in May of 2015. At that time, her white blood cell count was 3.4, hemoglobin 10.4, platelet count 78,000 with elevated INR. This patient had prior history of posttraumatic stress disorder, depression, and anorexia nervosa. She had weight loss recently. She complained of intermittent bleeding from her stool. She also has extensive bruising throughout. She denies recent epistaxis, hematuria, or heavy menstruation. In fact, she has not had a period for a long time. The patient denies history of liver disease, exposure to heparin, history of cardiac murmur/prior cardiovascular surgery or recent new medications She denies prior blood or platelet transfusions. On 10/25/2013, CT scan of the abdomen and pelvis revealed a large splenic mass, suspicious for hemangioma. 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 INTERVAL HISTORY: Mary Cooley 29 y.o. female returns for postoperative follow-up. She denies worsening bruising. She feels well. Her prior postprandial abdominal discomfort has resolved. The patient denies any recent signs or symptoms of bleeding such as spontaneous epistaxis, hematuria or hematochezia.   I have reviewed the past medical history, past surgical history, social history and family history with the patient and they are unchanged from previous note.  ALLERGIES:  is allergic to nsaids; ceclor; peanut-containing drug products; and penicillins.  MEDICATIONS:  Current Outpatient Prescriptions  Medication Sig Dispense Refill  . clonazePAM (KLONOPIN) 0.5 MG tablet Take 0.5 mg by mouth 3 (three) times daily as needed for anxiety.    . Multiple Vitamin (MULTIVITAMIN WITH MINERALS) TABS tablet Take 1 tablet by mouth daily.    Marland Kitchen oxyCODONE (OXY  IR/ROXICODONE) 5 MG immediate release tablet Take 1-2 tablets (5-10 mg total) by mouth every 4 (four) hours as needed for moderate pain, severe pain or breakthrough pain. 40 tablet 0  . promethazine (PHENERGAN) 12.5 MG tablet Take 12.5-25 mg by mouth every 6 (six) hours as needed for nausea or vomiting.    . sertraline (ZOLOFT) 100 MG tablet Take 100 mg by mouth every morning.      No current facility-administered medications for this visit.     REVIEW OF SYSTEMS:   Constitutional: Denies fevers, chills or night sweats Eyes: Denies blurriness of vision Ears, nose, mouth, throat, and face: Denies mucositis or sore throat Respiratory: Denies cough, dyspnea or wheezes Cardiovascular: Denies palpitation, chest discomfort or lower extremity swelling Gastrointestinal:  Denies nausea, heartburn or change in bowel habits Skin: Denies abnormal skin rashes Lymphatics: Denies new lymphadenopathy  Neurological:Denies numbness, tingling or new weaknesses Behavioral/Psych: Mood is stable, no new changes  All other systems were reviewed with the patient and are negative.  PHYSICAL EXAMINATION: ECOG PERFORMANCE STATUS: 0 - Asymptomatic  Filed Vitals:   04/07/14 1049  BP: 110/72  Pulse: 91  Temp: 97.8 F (36.6 C)  Resp: 18   Filed Weights   04/07/14 1049  Weight: 109 lb 3.2 oz (49.533 kg)    GENERAL:alert, no distress and comfortable SKIN: skin color, texture, turgor are normal, no rashes or significant lesions. Chronic bruising in her lower extremities noted EYES: normal, Conjunctiva are pink and non-injected, sclera clear OROPHARYNX:no exudate, no erythema and lips, buccal mucosa, and tongue normal  NECK: supple, thyroid normal size, non-tender, without nodularity LYMPH:  no palpable lymphadenopathy in the cervical, axillary or inguinal LUNGS: clear to auscultation and percussion with normal breathing effort HEART: regular rate & rhythm and no  murmurs and no lower extremity  edema ABDOMEN:abdomen soft, non-tender and normal bowel sounds. She has well-healed surgical scar Musculoskeletal:no cyanosis of digits and no clubbing  NEURO: alert & oriented x 3 with fluent speech, no focal motor/sensory deficits  LABORATORY DATA:  I have reviewed the data as listed Results for orders placed or performed in visit on 04/07/14 (from the past 48 hour(s))  CBC & Diff and Retic     Status: Abnormal   Collection Time: 04/07/14 10:30 AM  Result Value Ref Range   WBC 4.8 3.9 - 10.3 10e3/uL   NEUT# 3.0 1.5 - 6.5 10e3/uL   HGB 10.1 (L) 11.6 - 15.9 g/dL   HCT 27.8 (L) 56.4 - 54.8 %   Platelets 707 (H) 145 - 400 10e3/uL   MCV 91.0 79.5 - 101.0 fL   MCH 30.1 25.1 - 34.0 pg   MCHC 33.1 31.5 - 36.0 g/dL   RBC 0.48 (L) 8.94 - 5.32 10e6/uL   RDW 13.4 11.2 - 14.5 %   lymph# 1.0 0.9 - 3.3 10e3/uL   MONO# 0.6 0.1 - 0.9 10e3/uL   Eosinophils Absolute 0.1 0.0 - 0.5 10e3/uL   Basophils Absolute 0.0 0.0 - 0.1 10e3/uL   NEUT% 64.1 38.4 - 76.8 %   LYMPH% 20.6 14.0 - 49.7 %   MONO% 12.0 0.0 - 14.0 %   EOS% 2.5 0.0 - 7.0 %   BASO% 0.8 0.0 - 2.0 %   Retic % 1.14 0.70 - 2.10 %   Retic Ct Abs 38.19 33.70 - 90.70 10e3/uL   Immature Retic Fract 5.00 1.60 - 10.00 %  Lactate dehydrogenase     Status: None   Collection Time: 04/07/14 10:30 AM  Result Value Ref Range   LDH 235 125 - 245 U/L  Hold Tube, Blood Bank     Status: None   Collection Time: 04/07/14 10:30 AM  Result Value Ref Range   Hold Tube, Blood Bank Blood Bank Order Cancelled   Fibrinogen     Status: None   Collection Time: 04/07/14 10:31 AM  Result Value Ref Range   Fibrinogen 417 204 - 475 mg/dL  Comprehensive metabolic panel     Status: Abnormal   Collection Time: 04/07/14 10:32 AM  Result Value Ref Range   Sodium 133 (L) 136 - 145 mEq/L   Potassium 3.9 3.5 - 5.1 mEq/L   Chloride 97 (L) 98 - 109 mEq/L   CO2 26 22 - 29 mEq/L   Glucose 92 70 - 140 mg/dl   BUN 09.4 7.0 - 38.6 mg/dL   Creatinine 0.7 0.6 - 1.1 mg/dL    Total Bilirubin 5.94 0.20 - 1.20 mg/dL   Alkaline Phosphatase 70 40 - 150 U/L   AST 32 5 - 34 U/L   ALT 31 0 - 55 U/L   Total Protein 7.9 6.4 - 8.3 g/dL   Albumin 4.6 3.5 - 5.0 g/dL   Calcium 9.8 8.4 - 02.2 mg/dL   Anion Gap 11 3 - 11 mEq/L   EGFR >90 >90 ml/min/1.73 m2    Comment: eGFR is calculated using the CKD-EPI Creatinine Equation (2009)    Lab Results  Component Value Date   WBC 4.8 04/07/2014   HGB 10.1* 04/07/2014   HCT 30.5* 04/07/2014   MCV 91.0 04/07/2014   PLT 707* 04/07/2014    ASSESSMENT & PLAN:  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 The patient has received appropriate vaccination. She is doing well  clinically from recent surgery.   Anemia in chronic illness This could be related to chronic illness and recent blood loss. It is improving. Continue to monitor only.   Bruising She has persistent bruising clinically. However, repeat fibrinogen is now normal. I'm wondering whether her coagulopathy in the past could be related to consumption from mild chronic bleeding. I will recheck again in 1 month's time.   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 I reassured the patient that there is nothing we need to be worried about right now.    All questions were answered. The patient knows to call the clinic with any problems, questions or concerns. No barriers to learning was detected.  I spent 15 minutes counseling the patient face to face. The total time spent in the appointment was 20 minutes and more than 50% was on counseling.     [facility], Yoshino Broccoli, MD 2/5/20167:58 AM

## 2014-04-08 NOTE — Assessment & Plan Note (Signed)
This could be related to chronic illness and recent blood loss. It is improving. Continue to monitor only.

## 2014-04-08 NOTE — Assessment & Plan Note (Signed)
She has persistent bruising clinically. However, repeat fibrinogen is now normal. I'm wondering whether her coagulopathy in the past could be related to consumption from mild chronic bleeding. I will recheck again in 1 month's time.

## 2014-04-08 NOTE — Assessment & Plan Note (Signed)
Thankfully, 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 The patient has received appropriate vaccination. She is doing well clinically from recent surgery.

## 2014-04-08 NOTE — Assessment & Plan Note (Signed)
This is not an uncommon finding. I reassured the patient that there is nothing we need to be worried about right now.

## 2014-05-05 ENCOUNTER — Ambulatory Visit (HOSPITAL_BASED_OUTPATIENT_CLINIC_OR_DEPARTMENT_OTHER): Payer: Medicaid Other | Admitting: Hematology and Oncology

## 2014-05-05 ENCOUNTER — Encounter: Payer: Self-pay | Admitting: Hematology and Oncology

## 2014-05-05 ENCOUNTER — Other Ambulatory Visit (HOSPITAL_BASED_OUTPATIENT_CLINIC_OR_DEPARTMENT_OTHER): Payer: Medicaid Other

## 2014-05-05 ENCOUNTER — Telehealth: Payer: Self-pay | Admitting: Hematology and Oncology

## 2014-05-05 VITALS — BP 112/81 | HR 59 | Temp 97.6°F | Resp 18 | Ht 69.0 in | Wt 110.9 lb

## 2014-05-05 DIAGNOSIS — R238 Other skin changes: Secondary | ICD-10-CM | POA: Diagnosis not present

## 2014-05-05 DIAGNOSIS — R2242 Localized swelling, mass and lump, left lower limb: Secondary | ICD-10-CM

## 2014-05-05 DIAGNOSIS — R197 Diarrhea, unspecified: Secondary | ICD-10-CM

## 2014-05-05 DIAGNOSIS — D61818 Other pancytopenia: Secondary | ICD-10-CM

## 2014-05-05 DIAGNOSIS — D638 Anemia in other chronic diseases classified elsewhere: Secondary | ICD-10-CM | POA: Diagnosis present

## 2014-05-05 DIAGNOSIS — R161 Splenomegaly, not elsewhere classified: Secondary | ICD-10-CM

## 2014-05-05 DIAGNOSIS — T148XXA Other injury of unspecified body region, initial encounter: Secondary | ICD-10-CM

## 2014-05-05 DIAGNOSIS — D682 Hereditary deficiency of other clotting factors: Secondary | ICD-10-CM

## 2014-05-05 LAB — CBC & DIFF AND RETIC
BASO%: 0.4 % (ref 0.0–2.0)
Basophils Absolute: 0 10*3/uL (ref 0.0–0.1)
EOS ABS: 0 10*3/uL (ref 0.0–0.5)
EOS%: 0.6 % (ref 0.0–7.0)
HEMATOCRIT: 32.8 % — AB (ref 34.8–46.6)
HEMOGLOBIN: 11.2 g/dL — AB (ref 11.6–15.9)
IMMATURE RETIC FRACT: 2.4 % (ref 1.60–10.00)
LYMPH%: 44.3 % (ref 14.0–49.7)
MCH: 30.4 pg (ref 25.1–34.0)
MCHC: 34.1 g/dL (ref 31.5–36.0)
MCV: 89.1 fL (ref 79.5–101.0)
MONO#: 0.4 10*3/uL (ref 0.1–0.9)
MONO%: 7 % (ref 0.0–14.0)
NEUT%: 47.7 % (ref 38.4–76.8)
NEUTROS ABS: 2.6 10*3/uL (ref 1.5–6.5)
PLATELETS: 370 10*3/uL (ref 145–400)
RBC: 3.68 10*6/uL — ABNORMAL LOW (ref 3.70–5.45)
RDW: 13.2 % (ref 11.2–14.5)
Retic %: 0.51 % — ABNORMAL LOW (ref 0.70–2.10)
Retic Ct Abs: 18.77 10*3/uL — ABNORMAL LOW (ref 33.70–90.70)
WBC: 5.4 10*3/uL (ref 3.9–10.3)
lymph#: 2.4 10*3/uL (ref 0.9–3.3)

## 2014-05-05 LAB — HOLD TUBE, BLOOD BANK

## 2014-05-05 NOTE — Telephone Encounter (Signed)
Labs/ov per 03/03 POF scheduled, D/T per MD..... KJ

## 2014-05-05 NOTE — Assessment & Plan Note (Signed)
The cause of this mass is unknown, but I suspect it could be fibrosis on an old hematoma. I recommend ultrasound evaluation and she agreed to proceed. However, the patient requested to be done at the end of this month.

## 2014-05-05 NOTE — Progress Notes (Signed)
[place] OFFICE PROGRESS NOTE  Patient Care Team: Benito Mccreedy, MD as PCP - General (Internal Medicine) Michael Boston, MD as Consulting Physician (General Surgery) Heath Lark, MD as Consulting Physician (Hematology)  SUMMARY OF ONCOLOGIC HISTORY:  She was found to have abnormal CBC from routine blood work monitoring by her primary care provider in May of 2015. At that time, her white blood cell count was 3.4, hemoglobin 10.4, platelet count 78,000 with elevated INR. This patient had prior history of posttraumatic stress disorder, depression, and anorexia nervosa. She had weight loss recently. She complained of intermittent bleeding from her stool. She also has extensive bruising throughout. She denies recent epistaxis, hematuria, or heavy menstruation. In fact, she has not had a period for a long time. The patient denies history of liver disease, exposure to heparin, history of cardiac murmur/prior cardiovascular surgery or recent new medications She denies prior blood or platelet transfusions. On 10/25/2013, CT scan of the abdomen and pelvis revealed a large splenic mass, suspicious for hemangioma. 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  INTERVAL HISTORY: Please see below for problem oriented charting. She complained of profuse diarrhea starting the last few days. She cannot quantify how much and how frequent. She does not have any nausea or vomiting. She continues to have persistent, extensive bruises. She had an accident in the grocery store recently and was hit by a cart. Since then, she developed a large hard lump on the posterior aspect of her left calf. It does not bother her.  REVIEW OF SYSTEMS:   Constitutional: Denies fevers, chills or abnormal weight loss Eyes: Denies blurriness of vision Ears, nose, mouth, throat, and face: Denies mucositis or sore throat Respiratory: Denies cough, dyspnea or wheezes Cardiovascular:  Denies palpitation, chest discomfort or lower extremity swelling Skin: Denies abnormal skin rashes Lymphatics: Denies new lymphadenopathy  Neurological:Denies numbness, tingling or new weaknesses Behavioral/Psych: Mood is stable, no new changes  All other systems were reviewed with the patient and are negative.  I have reviewed the past medical history, past surgical history, social history and family history with the patient and they are unchanged from previous note.  ALLERGIES:  is allergic to nsaids; ceclor; peanut-containing drug products; and penicillins.  MEDICATIONS:  Current Outpatient Prescriptions  Medication Sig Dispense Refill  . clonazePAM (KLONOPIN) 0.5 MG tablet Take 0.5 mg by mouth 3 (three) times daily as needed for anxiety.    . Multiple Vitamin (MULTIVITAMIN WITH MINERALS) TABS tablet Take 1 tablet by mouth daily.    Marland Kitchen oxyCODONE (OXY IR/ROXICODONE) 5 MG immediate release tablet Take 1-2 tablets (5-10 mg total) by mouth every 4 (four) hours as needed for moderate pain, severe pain or breakthrough pain. 40 tablet 0  . promethazine (PHENERGAN) 12.5 MG tablet Take 12.5-25 mg by mouth every 6 (six) hours as needed for nausea or vomiting.    . sertraline (ZOLOFT) 100 MG tablet Take 100 mg by mouth every morning.      No current facility-administered medications for this visit.    PHYSICAL EXAMINATION: ECOG PERFORMANCE STATUS: 1 - Symptomatic but completely ambulatory  Filed Vitals:   05/05/14 1030  BP: 112/81  Pulse: 59  Temp: 97.6 F (36.4 C)  Resp: 18   Filed Weights   05/05/14 1030  Weight: 110 lb 14.4 oz (50.304 kg)    GENERAL:alert, no distress and comfortable. The patient appears to have poor hygiene SKIN: Extensive bruises are noted. EYES: normal, Conjunctiva are pink and non-injected, sclera clear  OROPHARYNX:no exudate, no erythema and lips, buccal mucosa, and tongue normal . Dry mucous membrane is noted. No thrush. NECK: supple, thyroid normal size,  non-tender, without nodularity LYMPH:  no palpable lymphadenopathy in the cervical, axillary or inguinal LUNGS: clear to auscultation and percussion with normal breathing effort HEART: regular rate & rhythm and no murmurs and no lower extremity edema ABDOMEN:abdomen soft, non-tender and normal bowel sounds. Well-healed surgical scar Musculoskeletal:no cyanosis of digits and no clubbing . Palpable nodule in the posterior part of the left calf NEURO: alert & oriented x 3 with fluent speech, no focal motor/sensory deficits  LABORATORY DATA:  I have reviewed the data as listed    Component Value Date/Time   NA 133* 04/07/2014 1032   NA 134* 04/01/2014 0458   K 3.9 04/07/2014 1032   K 4.9 04/01/2014 0458   CL 98 04/01/2014 0458   CO2 26 04/07/2014 1032   CO2 27 04/01/2014 0458   GLUCOSE 92 04/07/2014 1032   GLUCOSE 100* 04/01/2014 0458   BUN 11.3 04/07/2014 1032   BUN 8 04/01/2014 0458   CREATININE 0.7 04/07/2014 1032   CREATININE 0.52 04/01/2014 0458   CALCIUM 9.8 04/07/2014 1032   CALCIUM 9.4 04/01/2014 0458   PROT 7.9 04/07/2014 1032   PROT 7.8 07/17/2013 1130   ALBUMIN 4.6 04/07/2014 1032   ALBUMIN 5.0 07/17/2013 1130   AST 32 04/07/2014 1032   AST 36 07/17/2013 1130   ALT 31 04/07/2014 1032   ALT 29 07/17/2013 1130   ALKPHOS 70 04/07/2014 1032   ALKPHOS 58 07/17/2013 1130   BILITOT 0.49 04/07/2014 1032   BILITOT 0.4 07/17/2013 1130   GFRNONAA >90 04/01/2014 0458   GFRAA >90 04/01/2014 0458    No results found for: SPEP, UPEP  Lab Results  Component Value Date   WBC 5.4 05/05/2014   NEUTROABS 2.6 05/05/2014   HGB 11.2* 05/05/2014   HCT 32.8* 05/05/2014   MCV 89.1 05/05/2014   PLT 370 05/05/2014      Chemistry      Component Value Date/Time   NA 133* 04/07/2014 1032   NA 134* 04/01/2014 0458   K 3.9 04/07/2014 1032   K 4.9 04/01/2014 0458   CL 98 04/01/2014 0458   CO2 26 04/07/2014 1032   CO2 27 04/01/2014 0458   BUN 11.3 04/07/2014 1032   BUN 8  04/01/2014 0458   CREATININE 0.7 04/07/2014 1032   CREATININE 0.52 04/01/2014 0458      Component Value Date/Time   CALCIUM 9.8 04/07/2014 1032   CALCIUM 9.4 04/01/2014 0458   ALKPHOS 70 04/07/2014 1032   ALKPHOS 58 07/17/2013 1130   AST 32 04/07/2014 1032   AST 36 07/17/2013 1130   ALT 31 04/07/2014 1032   ALT 29 07/17/2013 1130   BILITOT 0.49 04/07/2014 1032   BILITOT 0.4 07/17/2013 1130     ASSESSMENT & PLAN:  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 The patient has received appropriate vaccination. She is doing well clinically from recent surgery.   Anemia in chronic illness This could be related to chronic illness and recent blood loss. It is improving. Continue to monitor only.   Bruising She has persistent bruising clinically. However, repeat fibrinogen recently was normal, level today is pending   Diarrhea Clinically, it does not appear to be caused by bacterial infection. It is likely just viral gastroenteritis I recommend she avoid dairy products, drink plenty of fluids and take Imodium  Mass of left lower leg The cause of this mass is unknown, but I suspect it could be fibrosis on an old hematoma. I recommend ultrasound evaluation and she agreed to proceed. However, the patient requested to be done at the end of this month.    Orders Placed This Encounter  Procedures  . [place] Misc Soft Tissue    Standing Status: Future     Number of Occurrences:      Standing Expiration Date: 07/05/2015    Order Specific Question:  Reason for Exam (SYMPTOM  OR DIAGNOSIS REQUIRED)    Answer:  left leg nodule around the calf? hematoma versus lipoma    Order Specific Question:  Preferred imaging location?    Answer:  [facility]  . CBC & Diff and Retic    Standing Status: Future     Number of Occurrences:      Standing Expiration Date: 06/09/2015  . Fibrinogen    Standing Status: Future     Number of Occurrences:       Standing Expiration Date: 06/09/2015   All questions were answered. The patient knows to call the clinic with any problems, questions or concerns. No barriers to learning was detected. I spent 25 minutes counseling the patient face to face. The total time spent in the appointment was 30 minutes and more than 50% was on counseling and review of test results     [facility], [place], MD 05/05/2014 11:41 AM

## 2014-05-05 NOTE — Assessment & Plan Note (Signed)
Clinically, it does not appear to be caused by bacterial infection. It is likely just viral gastroenteritis I recommend she avoid dairy products, drink plenty of fluids and take Imodium

## 2014-05-05 NOTE — Assessment & Plan Note (Signed)
She has persistent bruising clinically. However, repeat fibrinogen recently was normal, level today is pending

## 2014-05-05 NOTE — Assessment & Plan Note (Signed)
Thankfully, 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 The patient has received appropriate vaccination. She is doing well clinically from recent surgery.

## 2014-05-05 NOTE — Assessment & Plan Note (Signed)
This could be related to chronic illness and recent blood loss. It is improving. Continue to monitor only.

## 2014-05-06 LAB — FIBRINOGEN: FIBRINOGEN: 153 mg/dL — AB (ref 204–475)

## 2014-05-26 ENCOUNTER — Other Ambulatory Visit: Payer: Self-pay | Admitting: Hematology and Oncology

## 2014-05-26 ENCOUNTER — Ambulatory Visit (HOSPITAL_COMMUNITY): Payer: Medicaid Other

## 2014-05-26 DIAGNOSIS — R2242 Localized swelling, mass and lump, left lower limb: Secondary | ICD-10-CM

## 2014-05-27 ENCOUNTER — Telehealth: Payer: Self-pay | Admitting: *Deleted

## 2014-05-27 NOTE — Telephone Encounter (Signed)
Informed pt of need for Xray to be done before [place].  Explained insurance will not approve [place] until xray is done.  Pt told man in background this and he said loudly "Stop it! Just stop it right now!"  Pt then said she won't be able to do xray because it took insurance too long to pay for it last time she had an xray done.  Suggested pt she contact her insurance company.  But told her the insurance company does not require pre auth for xray.   Pt quickly said she had to get off the phone but would not be able to come in for xray at this time.

## 2014-05-27 NOTE — Telephone Encounter (Signed)
-----   Message from Heath Lark, MD sent at 05/26/2014  3:24 PM EDT ----- Regarding: RE: [place] MIsc Soft Tissue C[place],  I ordered Xray of leg per Vaughan Basta request. Can you the patient and tell her to come in for Xray first? Thanks ----- Message -----    From: Stanford Breed    Sent: 05/26/2014   2:51 PM      To: Heath Lark, MD Subject: [place] MIsc Soft Tissue                            05/26/2014 The request for this patient has been DENIED by [employer].  The rationale given was there have been no conventional x-rays taken.  Conventional x-rays do not require pre-cert from me. I wanted to make you aware of this as soon as I got a response for her insurance company.   I await your response, I will be ut of the office on tomorrow.  Stanford Breed [phone]

## 2014-06-01 ENCOUNTER — Telehealth (HOSPITAL_COMMUNITY): Payer: Self-pay | Admitting: Radiology

## 2014-06-01 NOTE — Telephone Encounter (Signed)
Told pt ins had denied and exam would be cancelled. She stated she had received notification in the mail from [facility] about the denial.

## 2014-06-02 ENCOUNTER — Ambulatory Visit (HOSPITAL_COMMUNITY): Payer: Medicaid Other

## 2014-10-09 ENCOUNTER — Encounter (HOSPITAL_COMMUNITY): Payer: Self-pay

## 2014-10-09 ENCOUNTER — Emergency Department (HOSPITAL_COMMUNITY): Payer: Medicaid Other

## 2014-10-09 ENCOUNTER — Emergency Department (HOSPITAL_COMMUNITY)
Admission: EM | Admit: 2014-10-09 | Discharge: 2014-10-11 | Disposition: A | Discharge status: Other Acute Inpatient Hospital | Payer: Medicaid Other | Attending: Emergency Medicine | Admitting: Emergency Medicine

## 2014-10-09 DIAGNOSIS — Z87828 Personal history of other (healed) physical injury and trauma: Secondary | ICD-10-CM | POA: Diagnosis not present

## 2014-10-09 DIAGNOSIS — Z720 Tobacco use: Secondary | ICD-10-CM | POA: Diagnosis not present

## 2014-10-09 DIAGNOSIS — Z3202 Encounter for pregnancy test, result negative: Secondary | ICD-10-CM | POA: Insufficient documentation

## 2014-10-09 DIAGNOSIS — Z880 Allergy status to penicillin: Secondary | ICD-10-CM | POA: Insufficient documentation

## 2014-10-09 DIAGNOSIS — R443 Hallucinations, unspecified: Secondary | ICD-10-CM | POA: Diagnosis not present

## 2014-10-09 DIAGNOSIS — Z79899 Other long term (current) drug therapy: Secondary | ICD-10-CM | POA: Insufficient documentation

## 2014-10-09 DIAGNOSIS — F430 Acute stress reaction: Secondary | ICD-10-CM

## 2014-10-09 DIAGNOSIS — F4310 Post-traumatic stress disorder, unspecified: Secondary | ICD-10-CM | POA: Diagnosis present

## 2014-10-09 DIAGNOSIS — Z862 Personal history of diseases of the blood and blood-forming organs and certain disorders involving the immune mechanism: Secondary | ICD-10-CM | POA: Insufficient documentation

## 2014-10-09 DIAGNOSIS — Z046 Encounter for general psychiatric examination, requested by authority: Secondary | ICD-10-CM | POA: Diagnosis present

## 2014-10-09 DIAGNOSIS — F29 Unspecified psychosis not due to a substance or known physiological condition: Secondary | ICD-10-CM | POA: Diagnosis present

## 2014-10-09 DIAGNOSIS — Z8619 Personal history of other infectious and parasitic diseases: Secondary | ICD-10-CM | POA: Insufficient documentation

## 2014-10-09 DIAGNOSIS — F22 Delusional disorders: Secondary | ICD-10-CM | POA: Diagnosis present

## 2014-10-09 DIAGNOSIS — R451 Restlessness and agitation: Secondary | ICD-10-CM | POA: Insufficient documentation

## 2014-10-09 LAB — I-STAT BETA HCG BLOOD, ED (MC, WL, AP ONLY)

## 2014-10-09 LAB — COMPREHENSIVE METABOLIC PANEL
ALT: 39 U/L (ref 14–54)
AST: 55 U/L — AB (ref 15–41)
Albumin: 5.3 g/dL — ABNORMAL HIGH (ref 3.5–5.0)
Alkaline Phosphatase: 53 U/L (ref 38–126)
Anion gap: 8 (ref 5–15)
BUN: 5 mg/dL — ABNORMAL LOW (ref 6–20)
CO2: 24 mmol/L (ref 22–32)
Calcium: 9.4 mg/dL (ref 8.9–10.3)
Chloride: 90 mmol/L — ABNORMAL LOW (ref 101–111)
Creatinine, Ser: 0.62 mg/dL (ref 0.44–1.00)
GFR calc Af Amer: >60 mL/min (ref >60)
GLUCOSE: 106 mg/dL — AB (ref 65–99)
Potassium: 4.8 mmol/L (ref 3.5–5.1)
SODIUM: 122 mmol/L — AB (ref 135–145)
Total Bilirubin: 0.8 mg/dL (ref 0.3–1.2)
Total Protein: 7.7 g/dL (ref 6.5–8.1)

## 2014-10-09 LAB — ETHANOL

## 2014-10-09 LAB — CBC WITH DIFFERENTIAL/PLATELET
BASOS PCT: 0 % (ref 0–1)
Basophils Absolute: 0 10*3/uL (ref 0.0–0.1)
EOS PCT: 0 % (ref 0–5)
Eosinophils Absolute: 0 10*3/uL (ref 0.0–0.7)
HCT: 34.6 % — ABNORMAL LOW (ref 36.0–46.0)
Hemoglobin: 12.3 g/dL (ref 12.0–15.0)
Lymphocytes Relative: 22 % (ref 12–46)
Lymphs Abs: 1.1 10*3/uL (ref 0.7–4.0)
MCH: 32.3 pg (ref 26.0–34.0)
MCHC: 35.5 g/dL (ref 30.0–36.0)
MCV: 90.8 fL (ref 78.0–100.0)
MONO ABS: 0.4 10*3/uL (ref 0.1–1.0)
MONOS PCT: 9 % (ref 3–12)
Neutro Abs: 3.3 10*3/uL (ref 1.7–7.7)
Neutrophils Relative %: 69 % (ref 43–77)
Platelets: 376 10*3/uL (ref 150–400)
RBC: 3.81 MIL/uL — AB (ref 3.87–5.11)
RDW: 13 % (ref 11.5–15.5)
WBC: 4.7 10*3/uL (ref 4.0–10.5)

## 2014-10-09 LAB — RAPID URINE DRUG SCREEN, HOSP PERFORMED
Amphetamines: NOT DETECTED
BARBITURATES: NOT DETECTED
Benzodiazepines: NOT DETECTED
COCAINE: NOT DETECTED
Opiates: NOT DETECTED
TETRAHYDROCANNABINOL: NOT DETECTED

## 2014-10-09 LAB — AMMONIA: Ammonia: 13 umol/L (ref 9–35)

## 2014-10-09 LAB — ACETAMINOPHEN LEVEL: Acetaminophen (Tylenol), Serum: <10 ug/mL — ABNORMAL LOW (ref 10–30)

## 2014-10-09 LAB — SALICYLATE LEVEL: Salicylate Lvl: <4 mg/dL (ref 2.8–30.0)

## 2014-10-09 MED ORDER — HALOPERIDOL LACTATE 5 MG/ML IJ SOLN
5.0000 mg | Freq: Once | INTRAMUSCULAR | Status: AC
  Administered 2014-10-09: 5 mg via INTRAMUSCULAR
  Filled 2014-10-09: qty 1

## 2014-10-09 MED ORDER — ADULT MULTIVITAMIN W/MINERALS CH
1.0000 | ORAL_TABLET | Freq: Every day | ORAL | Status: DC
  Administered 2014-10-10 – 2014-10-11 (×2): 1 via ORAL
  Filled 2014-10-09 (×2): qty 1

## 2014-10-09 MED ORDER — LORAZEPAM 2 MG/ML IJ SOLN
1.0000 mg | Freq: Once | INTRAMUSCULAR | Status: AC
  Administered 2014-10-09: 1 mg via INTRAMUSCULAR
  Filled 2014-10-09: qty 1

## 2014-10-09 MED ORDER — SERTRALINE HCL 50 MG PO TABS
100.0000 mg | ORAL_TABLET | Freq: Every morning | ORAL | Status: DC
  Administered 2014-10-10 – 2014-10-11 (×2): 100 mg via ORAL
  Filled 2014-10-09 (×3): qty 2

## 2014-10-09 MED ORDER — SODIUM CHLORIDE 0.9 % IV BOLUS (SEPSIS)
1000.0000 mL | Freq: Once | INTRAVENOUS | Status: DC
  Administered 2014-10-09: 1000 mL via INTRAVENOUS

## 2014-10-09 MED ORDER — SODIUM CHLORIDE 0.9 % IV BOLUS (SEPSIS)
1000.0000 mL | Freq: Once | INTRAVENOUS | Status: AC
  Administered 2014-10-09: 1000 mL via INTRAVENOUS

## 2014-10-09 MED ORDER — THIAMINE HCL 100 MG/ML IJ SOLN
100.0000 mg | Freq: Once | INTRAMUSCULAR | Status: AC
  Administered 2014-10-09: 100 mg via INTRAVENOUS
  Filled 2014-10-09: qty 2

## 2014-10-09 NOTE — ED Notes (Signed)
Blood draw unsuccessful 

## 2014-10-09 NOTE — ED Notes (Signed)
MD at bedside. 

## 2014-10-09 NOTE — ED Notes (Signed)
TTS UNABLE TO ASSESS DUE TO PT CURRENT STATUS- SEDATED

## 2014-10-09 NOTE — ED Notes (Signed)
4 point hobbled restraint and metal cuffs removed by [employer].
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
## 2014-10-09 NOTE — ED Notes (Signed)
Patient given water and crackers. 

## 2014-10-09 NOTE — ED Notes (Signed)
Patient transported to CT 

## 2014-10-09 NOTE — ED Notes (Signed)
02 SATS DECREASED TO 85%-88% 4LNC APPLIED TO MAINTAIN > THAN 92 %. PT CURRENTLY AT 100% FOR SUPPORT. EDP FLOYD MADE AWARE.

## 2014-10-09 NOTE — ED Notes (Signed)
DECREASED TO [facility] FOR SUPPORT

## 2014-10-09 NOTE — ED Notes (Signed)
Bed: UZ99 Expected date:  Expected time:  Means of arrival:  Comments: EMS-Combative pt
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
## 2014-10-10 DIAGNOSIS — F22 Delusional disorders: Secondary | ICD-10-CM | POA: Diagnosis present

## 2014-10-10 DIAGNOSIS — F29 Unspecified psychosis not due to a substance or known physiological condition: Secondary | ICD-10-CM | POA: Diagnosis present

## 2014-10-10 MED ORDER — OLANZAPINE 5 MG PO TBDP
5.0000 mg | ORAL_TABLET | Freq: Two times a day (BID) | ORAL | Status: DC
  Administered 2014-10-10 – 2014-10-11 (×3): 5 mg via ORAL
  Filled 2014-10-10 (×3): qty 1
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
## 2014-10-10 NOTE — ED Notes (Signed)
Patient is calm, cooperative; disheveled, drowsy. Denies SI, HI, AVH. Denies feelings of anxiety, rates depression 2/10. States that Mary Cooley "wants to get home and check on her cats". Patient hopes to go home in the morning.   Encouragement offered. Environment adjusted.  Q 15 safety checks continue.

## 2014-10-10 NOTE — ED Notes (Signed)
Pt oriented to room and to unit.  Alert and oriented and in no distress.  Pt appears very sleepy.  15 minute checks in place.

## 2014-10-10 NOTE — Progress Notes (Signed)
Patient referred for psych treatment at: [place], [place] Disposition staff 10/10/2014 6:56 PM

## 2014-10-10 NOTE — ED Notes (Addendum)
Pt is pleasant . She states she has a hard time remembering things like her BF's phone number. Pt discussed about her two cats. Pt was given coffee and orange juice and does contract for safety. Pt denies SI and HI. She is dishelved. 8:20a-Pt requested cereal for breakfast. She stated,"I can not have bacon touching anything."9:45a-Report to Eddie. Pt walked back to room 37.

## 2014-10-11 ENCOUNTER — Encounter (HOSPITAL_COMMUNITY): Payer: Self-pay | Admitting: Behavioral Health

## 2014-10-11 ENCOUNTER — Inpatient Hospital Stay (HOSPITAL_COMMUNITY)
Admission: AD | Admit: 2014-10-11 | Discharge: 2014-10-14 | DRG: 885 | Disposition: A | Discharge status: Home / Self Care | Payer: Medicaid Other | Source: Intra-hospital | Attending: Psychiatry | Admitting: Psychiatry

## 2014-10-11 DIAGNOSIS — F29 Unspecified psychosis not due to a substance or known physiological condition: Secondary | ICD-10-CM | POA: Diagnosis present

## 2014-10-11 DIAGNOSIS — Z87828 Personal history of other (healed) physical injury and trauma: Secondary | ICD-10-CM | POA: Diagnosis not present

## 2014-10-11 DIAGNOSIS — F845 Asperger's syndrome: Secondary | ICD-10-CM | POA: Diagnosis present

## 2014-10-11 DIAGNOSIS — F410 Panic disorder [episodic paroxysmal anxiety] without agoraphobia: Secondary | ICD-10-CM | POA: Diagnosis present

## 2014-10-11 DIAGNOSIS — Z046 Encounter for general psychiatric examination, requested by authority: Secondary | ICD-10-CM | POA: Diagnosis present

## 2014-10-11 DIAGNOSIS — E871 Hypo-osmolality and hyponatremia: Secondary | ICD-10-CM | POA: Diagnosis not present

## 2014-10-11 DIAGNOSIS — F17210 Nicotine dependence, cigarettes, uncomplicated: Secondary | ICD-10-CM | POA: Diagnosis present

## 2014-10-11 DIAGNOSIS — F13950 Sedative, hypnotic or anxiolytic use, unspecified with sedative, hypnotic or anxiolytic-induced psychotic disorder with delusions: Secondary | ICD-10-CM | POA: Diagnosis not present

## 2014-10-11 DIAGNOSIS — F4310 Post-traumatic stress disorder, unspecified: Secondary | ICD-10-CM | POA: Diagnosis present

## 2014-10-11 DIAGNOSIS — Z8619 Personal history of other infectious and parasitic diseases: Secondary | ICD-10-CM | POA: Diagnosis not present

## 2014-10-11 DIAGNOSIS — E43 Unspecified severe protein-calorie malnutrition: Secondary | ICD-10-CM | POA: Diagnosis present

## 2014-10-11 DIAGNOSIS — Z880 Allergy status to penicillin: Secondary | ICD-10-CM | POA: Diagnosis not present

## 2014-10-11 DIAGNOSIS — Z91410 Personal history of adult physical and sexual abuse: Secondary | ICD-10-CM

## 2014-10-11 DIAGNOSIS — E221 Hyperprolactinemia: Secondary | ICD-10-CM | POA: Diagnosis present

## 2014-10-11 DIAGNOSIS — Z720 Tobacco use: Secondary | ICD-10-CM | POA: Diagnosis not present

## 2014-10-11 DIAGNOSIS — F1320 Sedative, hypnotic or anxiolytic dependence, uncomplicated: Secondary | ICD-10-CM | POA: Diagnosis present

## 2014-10-11 DIAGNOSIS — Z681 Body mass index (BMI) 19 or less, adult: Secondary | ICD-10-CM

## 2014-10-11 DIAGNOSIS — F411 Generalized anxiety disorder: Secondary | ICD-10-CM | POA: Diagnosis present

## 2014-10-11 DIAGNOSIS — Z862 Personal history of diseases of the blood and blood-forming organs and certain disorders involving the immune mechanism: Secondary | ICD-10-CM | POA: Diagnosis not present

## 2014-10-11 DIAGNOSIS — F22 Delusional disorders: Principal | ICD-10-CM | POA: Diagnosis present

## 2014-10-11 DIAGNOSIS — F209 Schizophrenia, unspecified: Secondary | ICD-10-CM | POA: Diagnosis not present

## 2014-10-11 DIAGNOSIS — F419 Anxiety disorder, unspecified: Secondary | ICD-10-CM | POA: Diagnosis not present

## 2014-10-11 DIAGNOSIS — G4700 Insomnia, unspecified: Secondary | ICD-10-CM | POA: Diagnosis present

## 2014-10-11 DIAGNOSIS — F329 Major depressive disorder, single episode, unspecified: Secondary | ICD-10-CM | POA: Diagnosis not present

## 2014-10-11 DIAGNOSIS — R451 Restlessness and agitation: Secondary | ICD-10-CM | POA: Diagnosis not present

## 2014-10-11 DIAGNOSIS — Z79899 Other long term (current) drug therapy: Secondary | ICD-10-CM | POA: Diagnosis not present

## 2014-10-11 MED ORDER — ENSURE ENLIVE PO LIQD: 237.0000 mL | Freq: Two times a day (BID) | ORAL | Status: DC

## 2014-10-11 MED ORDER — MIRTAZAPINE 15 MG PO TABS
15.0000 mg | ORAL_TABLET | Freq: Every day | ORAL | Status: DC
  Filled 2014-10-11 (×4): qty 1

## 2014-10-11 MED ORDER — ALUM & MAG HYDROXIDE-SIMETH 200-200-20 MG/5ML PO SUSP: 30.0000 mL | ORAL | Status: DC | PRN

## 2014-10-11 MED ORDER — ACETAMINOPHEN 325 MG PO TABS
650.0000 mg | ORAL_TABLET | Freq: Four times a day (QID) | ORAL | Status: DC | PRN
  Administered 2014-10-13 – 2014-10-14 (×2): 650 mg via ORAL
  Filled 2014-10-11 (×2): qty 2

## 2014-10-11 MED ORDER — ENSURE ENLIVE PO LIQD
237.0000 mL | Freq: Four times a day (QID) | ORAL | Status: DC
  Administered 2014-10-11 – 2014-10-14 (×10): 237 mL via ORAL

## 2014-10-11 MED ORDER — OLANZAPINE 5 MG PO TBDP
5.0000 mg | ORAL_TABLET | Freq: Two times a day (BID) | ORAL | Status: DC
Start: 2014-10-11 — End: 2014-10-12
  Administered 2014-10-11 – 2014-10-12 (×2): 5 mg via ORAL
  Filled 2014-10-11 (×8): qty 1

## 2014-10-11 MED ORDER — ADULT MULTIVITAMIN W/MINERALS CH
1.0000 | ORAL_TABLET | Freq: Every day | ORAL | Status: DC
  Administered 2014-10-12 – 2014-10-14 (×3): 1 via ORAL
  Filled 2014-10-11 (×6): qty 1

## 2014-10-11 MED ORDER — MAGNESIUM HYDROXIDE 400 MG/5ML PO SUSP: 30.0000 mL | Freq: Every day | ORAL | Status: DC | PRN

## 2014-10-11 NOTE — ED Notes (Signed)
Family at bedside. 
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
## 2014-10-11 NOTE — Progress Notes (Signed)
Pt refused to attend supper in the cafeteria stating that it's to stressful. When offered a tray, pt refused. Pt stated that she's satisfied with eating her snack (goldfish).
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
## 2014-10-11 NOTE — Progress Notes (Signed)
Adult Psychoeducational Group Note  Date:  10/11/2014 Time:  8:30 PM  Group Topic/Focus:  Wrap-Up Group:   The focus of this group is to help patients review their daily goal of treatment and discuss progress on daily workbooks.  Participation Level:  Active  Participation Quality:  Appropriate  Affect:  Appropriate  Cognitive:  Appropriate  Insight: Appropriate  Engagement in Group:  Engaged  Modes of Intervention:  Discussion  Additional Comments:THe patient expressed that she had a good day.The patient also said that she attended group earlier.  Nash Shearer 10/11/2014, 8:30 PM

## 2014-10-11 NOTE — Tx Team (Signed)
Initial Interdisciplinary Treatment Plan   PATIENT STRESSORS: Financial difficulties Marital or family conflict   PATIENT STRENGTHS: Motivation for treatment/growth Supportive family/friends   PROBLEM LIST: Problem List/Patient Goals Date to be addressed Date deferred Reason deferred Estimated date of resolution  "be happy" 10/11/14     "spend time with boyfriend" 10/11/14                                                DISCHARGE CRITERIA:  Ability to meet basic life and health needs Adequate post-discharge living arrangements Improved stabilization in mood, thinking, and/or behavior  PRELIMINARY DISCHARGE PLAN: Attend aftercare/continuing care group Attend PHP/IOP  PATIENT/FAMIILY INVOLVEMENT: This treatment plan has been presented to and reviewed with the patient, Mary Cooley, and/or family member. The patient and family have been given the opportunity to ask questions and make suggestions.  Mary Cooley L 10/11/2014, 4:27 PM

## 2014-10-11 NOTE — Progress Notes (Signed)
D: Patient in her room on approach.  Patient states she is feeling a little better than when she first came in.  Patient states she has been eating the food that has been brought to her.  Patient is drinking fluids and ensure.  Patient states she does not know why she has lost so much weight recently.  Patient's conversation is disorganized.  Patient denies SI/HI and denies AVH.  Patient states she is waiting for her boyfriend [name] to come visit her. Patient's O2 level was checked and it was 100% on room air.  A: Staff to monitor Q 15 mins for safety.  Encouragement and support offered.  Scheduled medications administered per orders. R: Patient remains safe on the unit.  Patient did not attend group tonight. Patient not visible on the unit tonight.  Patient taking administered medications.  Patient refused he Remeron for sleep tonight.

## 2014-10-11 NOTE — Progress Notes (Signed)
Pt accepted to 502-1 at [facility]. Patient signed voluntary and consent to release information. Per AC bed to be available in an hour. CSW informed rn who will call report.   Belia Heman, [place] Work  [employer] [phone]

## 2014-10-12 ENCOUNTER — Encounter (HOSPITAL_COMMUNITY): Payer: Self-pay | Admitting: Psychiatry

## 2014-10-12 DIAGNOSIS — F13950 Sedative, hypnotic or anxiolytic use, unspecified with sedative, hypnotic or anxiolytic-induced psychotic disorder with delusions: Secondary | ICD-10-CM | POA: Diagnosis present

## 2014-10-12 DIAGNOSIS — F29 Unspecified psychosis not due to a substance or known physiological condition: Secondary | ICD-10-CM | POA: Diagnosis present

## 2014-10-12 LAB — BASIC METABOLIC PANEL
ANION GAP: 11 (ref 5–15)
BUN: 8 mg/dL (ref 6–20)
CALCIUM: 10.1 mg/dL (ref 8.9–10.3)
CO2: 28 mmol/L (ref 22–32)
CREATININE: 0.51 mg/dL (ref 0.44–1.00)
Chloride: 99 mmol/L — ABNORMAL LOW (ref 101–111)
GFR calc Af Amer: >60 mL/min (ref >60)
GLUCOSE: 72 mg/dL (ref 65–99)
Potassium: 3.5 mmol/L (ref 3.5–5.1)
Sodium: 138 mmol/L (ref 135–145)

## 2014-10-12 MED ORDER — CLONAZEPAM 0.5 MG PO TABS
0.2500 mg | ORAL_TABLET | Freq: Three times a day (TID) | ORAL | Status: DC | PRN
  Administered 2014-10-12: 0.25 mg via ORAL
  Filled 2014-10-12: qty 1

## 2014-10-12 MED ORDER — HALOPERIDOL 5 MG PO TABS
5.0000 mg | ORAL_TABLET | Freq: Two times a day (BID) | ORAL | Status: DC
  Filled 2014-10-12 (×4): qty 1

## 2014-10-12 MED ORDER — HALOPERIDOL 2 MG PO TABS
2.5000 mg | ORAL_TABLET | Freq: Two times a day (BID) | ORAL | Status: DC
  Administered 2014-10-12 – 2014-10-14 (×4): 2.5 mg via ORAL
  Filled 2014-10-12 (×6): qty 1

## 2014-10-12 MED ORDER — BENZTROPINE MESYLATE 0.5 MG PO TABS
0.5000 mg | ORAL_TABLET | Freq: Two times a day (BID) | ORAL | Status: DC
  Administered 2014-10-12 – 2014-10-14 (×4): 0.5 mg via ORAL
  Filled 2014-10-12 (×2): qty 1
  Filled 2014-10-12: qty 30
  Filled 2014-10-12: qty 1
  Filled 2014-10-12: qty 30
  Filled 2014-10-12 (×3): qty 1

## 2014-10-12 MED ORDER — MIRTAZAPINE 15 MG PO TABS
15.0000 mg | ORAL_TABLET | Freq: Every day | ORAL | Status: DC
  Administered 2014-10-12 – 2014-10-13 (×2): 15 mg via ORAL
  Filled 2014-10-12 (×4): qty 1
  Filled 2014-10-12: qty 15

## 2014-10-12 MED ORDER — SERTRALINE HCL 100 MG PO TABS
100.0000 mg | ORAL_TABLET | Freq: Every day | ORAL | Status: DC
  Administered 2014-10-12 – 2014-10-14 (×3): 100 mg via ORAL
  Filled 2014-10-12: qty 2
  Filled 2014-10-12 (×2): qty 1
  Filled 2014-10-12: qty 15
  Filled 2014-10-12 (×2): qty 1

## 2014-10-12 NOTE — [facility] Group Notes (Signed)
[place] LCSW Group Therapy 10/12/2014 1:15 PM Type of Therapy:? Group Therapy Participation Level:? minimal/drowsy and sleeping throughout the speaker's visit Participation Quality:? minimal/poor Affect:?Flat Cognitive:?disengaged/sleeping Insight:? Developing/Improving and Engaged Engagement in Therapy:? Developing/Improving and Engaged Modes of Intervention:? Activity, Clarification, Confrontation, Discussion, Education, Exploration, Limit-setting, Orientation, Problem-solving, Rapport Building, Art therapist, Socialization and Support Summary of Progress/Problems: Patient was not attentive  with speaker- drowsy and sleeping throughout speakers visit.
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
## 2014-10-12 NOTE — Plan of Care (Signed)
Problem: Alteration in thought process Goal: LTG-Patient has not harmed self or others in at least 2 days Outcome: Progressing Patient has not harmed herself or others on hte is admission.  Patient denies SI/HI and verbally contracts for safety.  Problem: Alteration in mood; excessive anxiety as evidenced by: Goal: LTG-Patient's behavior demonstrates decreased anxiety (Patient's behavior demonstrates anxiety and he/she is utilizing learned coping skills to deal with anxiety-producing situations)  Outcome: Progressing Paitnet states she was less anxious today.  Patient did start to get anxious when she found out she was getting a roommate but she was able to calm herself down and stated, "I will try to make the best of it.'

## 2014-10-12 NOTE — Progress Notes (Signed)
D:Patient in her room on approach.  Patient states hse had a good day.  Patient states she has tried to eat more today.  Patient states she has been spending time in her room reading and waiting on Reuben.  Patient states she has been taking her medications.  Patient states she her goal was to try to attend groups and participate.  Patient states she went to the gym today and she went to one group.  Patient states she enjoyed the music therapy group. Patient denies SI/HI and denies AVH.  Patient showered with assistance from staff tonight. A: Staff to monitor Q 15 mins for safety.  Encouragement and support offered.  Scheduled medications administered per orders.  Staff has offered patient additional snacks tonight which patient has accepted.  Writer assisted patient with showering tonight. R: Patient remains safe on the unit.  Patient attended group tonight.  Patient not visible on the unit tonight.  Patient taking administered medications.  Patient showered.

## 2014-10-12 NOTE — Progress Notes (Signed)
NUTRITION ASSESSMENT  RD consulted for nutritional assessment and pt identified as at risk on the Malnutrition Screen Tool.  INTERVENTION: 1. Educated patient on the importance of nutrition and encouraged intake of food and beverages. 2. Discussed weight goals. 3. Supplements: Continue Ensure Enlive po QID, each supplement provides 350 kcal and 20 grams of protein 4. Recommend providing some information on free meals available in the [place] area  NUTRITION DIAGNOSIS: Unintentional weight loss related to sub-optimal intake as evidenced by pt report.   Goal: Pt to meet >/= 90% of their estimated nutrition needs.  Monitor:  PO intake  Assessment:  Pt admitted with psychosis, recent weight loss and refusal to eat.  Pt reports improved appetite since admission. Pt snacks on things like goldfish, graham crackers, and juice. Pt reports liking the Ensure supplements that are provided. Pt reports she has financial and transportation issues which prevent her from accessing food and her ability to buy nutritional supplements which she needs. Pt states that her boyfriend and her are living off disability checks. Pt relies on fast food and was recently upset about a [employer] near where she lives closing down.  Pt reports she does not eat pork and red meat, she avoids nuts d/t allergy and she is lactose intolerant. Pt states that she has social anxiety which causes her to not want to eat in crowds. Lunch was provided by tech during visit. RD encouraged pt to eat her protein (chicken) and her mixed vegetables. Pt states she likes to eat vegetables more than anything else.   Pt is underweight. Pt meets criteria for severe MALNUTRITION in the context of social/environmental circumstances as evidenced by severe fat and muscle depletion and 24% weight loss x 7 months.  Recommend continue to provide Ensure QID, encourage PO intake and if possible provide information at discharge of places in [place]  that offer free meals.  Height: Ht Readings from Last 1 Encounters:  10/11/14 5\' 7"  (1.702 m)    Weight: Wt Readings from Last 1 Encounters:  10/11/14 87 lb (39.463 kg)    Weight Hx: Wt Readings from Last 10 Encounters:  10/11/14 87 lb (39.463 kg)  05/05/14 110 lb 14.4 oz (50.304 kg)  04/07/14 109 lb 3.2 oz (49.533 kg)  03/31/14 114 lb (51.71 kg)  03/22/14 114 lb 3.2 oz (51.801 kg)  10/28/13 120 lb (54.432 kg)  10/18/13 121 lb 11.2 oz (55.203 kg)  10/04/13 121 lb 14.4 oz (55.293 kg)  09/29/13 121 lb 14.4 oz (55.293 kg)    BMI:  Body mass index is 13.62 kg/(m^2). Pt meets criteria for underweight based on current BMI.  Estimated Nutritional Needs: Kcal: 25-30 kcal/kg Protein: > 1 gram protein/kg Fluid: 1 ml/kcal  Diet Order:   Pt is also offered choice of unit snacks mid-morning and mid-afternoon.  Pt is eating as desired.   Lab results and medications reviewed.   Clayton Bibles, MS, RD, LDN Pager: [phone] After Hours Pager: [phone]
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
## 2014-10-12 NOTE — Progress Notes (Cosign Needed)
D) Pt affect wide eyed, mood cooperative on approach. Pt has been tangential and can be hyperverbal. Pt can be intrusive at times, frequently coming up to nurses station and standing until redirected. Mary Cooley has been attempting to attend groups however has been unable to stay. Pt did go down to the gym with prompting. Mary Cooley has been eating meals and snacks in her room. Intake has been minimal. Nutrition consult done. Pt reports that her glasses are at home with boyfriend and that he would bring them when he can get transportation. Pt states that vision is limited without her glasses although has been ambulating fine and reading magazines. A) Level 3 obs for safety, support and reassurance provided. Med ed reinforced. Redirect as needed. Encourage snacks, Ensure. R) Cooperative.

## 2014-10-12 NOTE — Discharge Instructions (Signed)
Nutrition Post Hospital Stay °Proper nutrition can help your body recover from illness and injury.   °Foods and beverages high in protein, vitamins, and minerals help rebuild muscle loss, promote healing, & reduce fall risk.  ° °•In addition to eating healthy foods, a nutrition shake is an easy, delicious way to get the nutrition you need during and after your hospital stay ° °It is recommended that you continue to drink at least 2 bottles per day of:       Ensure Enlive for at least 1 month (30 days) after your hospital stay  ° °Tips for adding a nutrition shake into your routine: °As allowed, drink one with vitamins or medications instead of water or juice °Enjoy one as a tasty mid-morning or afternoon snack °Drink cold or make a milkshake out of it °Drink one instead of milk with cereal or snacks °Use as a coffee creamer °  °Available at the following grocery stores and pharmacies:           °* [employer] * [employer] * [employer]  °* [employer]          * Walmart * [employer]  °* Wal[facility]reens      * Target  * [employer]J's   °* [employer]S  * [employer]   °* [facility] [phone]  °          °For COUPONS visit: www.ensure.com/join or www.boost.com/members/sign-up  ° °Suggested Substitutions °Ensure Plus = Boost Plus = Carnation Breakfast Essentials = Boost Compact °Ensure Active Clear = Boost Breeze °Glucerna Shake = Boost Glucose Control = Carnation Breakfast Essentials SUGAR FREE ° °  ° °

## 2014-10-12 NOTE — BHH Group Notes (Signed)
[facility] LCSW Aftercare Discharge Planning Group Note   10/12/2014 12:53 PM  Participation Quality:  Minimal   Mood/Affect:  Flat and Resistant  Depression Rating:       Anxiety Rating:    Thoughts of Suicide:  No Will you contract for safety?   Yes  Current AVH:  No  Plan for Discharge/Comments:  Plans to return home with her boyfriend and cats and follow up with outpatient providers.. Transportation Means: Boyfriend     Supports:Boyfriend   Ludwig Clarks

## 2014-10-13 DIAGNOSIS — F22 Delusional disorders: Principal | ICD-10-CM

## 2014-10-13 DIAGNOSIS — F845 Asperger's syndrome: Secondary | ICD-10-CM | POA: Diagnosis present

## 2014-10-13 DIAGNOSIS — F410 Panic disorder [episodic paroxysmal anxiety] without agoraphobia: Secondary | ICD-10-CM

## 2014-10-13 DIAGNOSIS — F1320 Sedative, hypnotic or anxiolytic dependence, uncomplicated: Secondary | ICD-10-CM

## 2014-10-13 DIAGNOSIS — F411 Generalized anxiety disorder: Secondary | ICD-10-CM | POA: Diagnosis present

## 2014-10-13 LAB — LIPID PANEL
CHOLESTEROL: 192 mg/dL (ref 0–200)
HDL: 87 mg/dL (ref >40)
LDL Cholesterol: 79 mg/dL (ref 0–99)
TRIGLYCERIDES: 132 mg/dL (ref <150)
Total CHOL/HDL Ratio: 2.2 RATIO
VLDL: 26 mg/dL (ref 0–40)

## 2014-10-13 LAB — TSH: TSH: 0.868 u[IU]/mL (ref 0.350–4.500)

## 2014-10-13 NOTE — Progress Notes (Signed)
D    Pt is pleasant on approach  She has some pressured speech and anxiety   She attended karaoke group and sang several songs   She said she started crying during the group because she was so moved by a song and said it wasn't a bad cry but a good cry   A   Verbal support and encouragement given   Medications administered and effectiveness monitored   Educate on medications    Q 15 min checks R   Pt safe at present and verbalizes understanding of medications

## 2014-10-13 NOTE — Progress Notes (Signed)
D: Patient is alert and oriented. Pt's mood and affect are anxious. Pt is child-like at times. Pt denies SI/HI and AVH. Pt's speech is tangential and disorganized. Pt rates depression and hopelessness 2/10 and anxiety 3/10. Pt reports her goal for the day is "write and try to draw something cute." Pt needs minimal assist with ADLs d/t not seeing well without her glasses. Pt reports her boyfriend is suppose to bring her glasses to her today. Pt experiencing tachycardia when standing, denies symptoms. Pt C/O headache this morning 3/10 with relief from PRN medication. Pt is attending unit groups today. A: Active listening by RN. Encouragement/Support provided to pt. Medication education reviewed with pt. PRN medication administered for pain per providers orders (See MAR). Scheduled medications administered per providers orders (See MAR). 15 minute checks continued per protocol for patient safety.  R: Patient cooperative and receptive to nursing interventions. Pt remains safe.

## 2014-10-13 NOTE — BHH Group Notes (Signed)
[facility] Group Notes:  (Counselor/Nursing/MHT/Case Management/Adjunct)  10/13/2014 1:15PM  Type of Therapy:  Group Therapy  Participation Level:  Active  Participation Quality:  Appropriate  Affect:  Flat  Cognitive:  Oriented  Insight:  Improving  Engagement in Group:  Limited  Engagement in Therapy:  Limited  Modes of Intervention:  Discussion, Exploration and Socialization  Summary of Progress/Problems: The topic for group was balance in life.  Pt participated in the discussion about when their life was in balance and out of balance and how this feels.  Pt discussed ways to get back in balance and short term goals they can work on to get where they want to be. Melba was engaged and active throughout.  Stated she is balanced because she is doing her drawings, and that helps her.  Asked for permission to share some with the group, and was delighted at the positive feedback she got from others.  Talked at length about her boyfriend and cats-it's clear that is her world.  Gave feedback to others [appropriately.]   Trish Mage 10/13/2014 2:46 PM

## 2014-10-13 NOTE — Plan of Care (Signed)
Problem: Ineffective individual coping Goal: STG: Patient will remain free from self harm Outcome: Progressing Patient remains free from self harm. 15 minute checks continued per protocol for patient safety.   Problem: Alteration in thought process Goal: STG-Patient is able to follow short directions Outcome: Progressing Patient is able to follow short directions today with ease.  Problem: Alteration in mood; excessive anxiety as evidenced by: Goal: STG-Pt will report an absence of self-harm thoughts/actions (Patient will report an absence of self-harm thoughts or actions)  Outcome: Progressing Patient denies having any suicidal thoughts today.

## 2014-10-13 NOTE — [facility] Group Notes (Signed)
[facility] Group Notes:  (Nursing/MHT/Case Management/Adjunct)  Date:  10/13/2014  Time:  0915am  Type of Therapy:  Nurse Education  Participation Level:  Active  Participation Quality:  Appropriate and Attentive  Affect:  Anxious  Cognitive:  Alert and Disorganized  Insight:  Good  Engagement in Group:  Engaged  Modes of Intervention:  Discussion, Education and Support  Summary of Progress/Problems: Patient attended group, remained engaged and responded appropriately when prompted. Pt's speech is tangential at times.  Charlyne Quale A 10/13/2014, 11:38 AM
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
## 2014-10-14 ENCOUNTER — Encounter (HOSPITAL_COMMUNITY): Payer: Self-pay

## 2014-10-14 ENCOUNTER — Emergency Department (HOSPITAL_COMMUNITY)
Admission: EM | Admit: 2014-10-14 | Discharge: 2014-10-15 | Disposition: A | Discharge status: Other Acute Inpatient Hospital | Payer: Medicaid Other | Attending: Emergency Medicine | Admitting: Emergency Medicine

## 2014-10-14 DIAGNOSIS — Z8619 Personal history of other infectious and parasitic diseases: Secondary | ICD-10-CM | POA: Insufficient documentation

## 2014-10-14 DIAGNOSIS — Z87828 Personal history of other (healed) physical injury and trauma: Secondary | ICD-10-CM | POA: Insufficient documentation

## 2014-10-14 DIAGNOSIS — Z880 Allergy status to penicillin: Secondary | ICD-10-CM | POA: Insufficient documentation

## 2014-10-14 DIAGNOSIS — F411 Generalized anxiety disorder: Secondary | ICD-10-CM | POA: Diagnosis present

## 2014-10-14 DIAGNOSIS — F845 Asperger's syndrome: Secondary | ICD-10-CM | POA: Diagnosis not present

## 2014-10-14 DIAGNOSIS — F22 Delusional disorders: Secondary | ICD-10-CM | POA: Diagnosis not present

## 2014-10-14 DIAGNOSIS — Z862 Personal history of diseases of the blood and blood-forming organs and certain disorders involving the immune mechanism: Secondary | ICD-10-CM | POA: Insufficient documentation

## 2014-10-14 DIAGNOSIS — F209 Schizophrenia, unspecified: Secondary | ICD-10-CM | POA: Diagnosis not present

## 2014-10-14 DIAGNOSIS — F329 Major depressive disorder, single episode, unspecified: Secondary | ICD-10-CM | POA: Insufficient documentation

## 2014-10-14 DIAGNOSIS — Z79899 Other long term (current) drug therapy: Secondary | ICD-10-CM | POA: Insufficient documentation

## 2014-10-14 DIAGNOSIS — F1320 Sedative, hypnotic or anxiolytic dependence, uncomplicated: Secondary | ICD-10-CM | POA: Diagnosis present

## 2014-10-14 DIAGNOSIS — F419 Anxiety disorder, unspecified: Secondary | ICD-10-CM | POA: Insufficient documentation

## 2014-10-14 DIAGNOSIS — E871 Hypo-osmolality and hyponatremia: Secondary | ICD-10-CM | POA: Insufficient documentation

## 2014-10-14 DIAGNOSIS — Z720 Tobacco use: Secondary | ICD-10-CM | POA: Insufficient documentation

## 2014-10-14 DIAGNOSIS — E221 Hyperprolactinemia: Secondary | ICD-10-CM | POA: Clinically undetermined

## 2014-10-14 HISTORY — DX: Schizophrenia, unspecified: F20.9

## 2014-10-14 HISTORY — DX: Delusional disorders: F22

## 2014-10-14 LAB — RAPID URINE DRUG SCREEN, HOSP PERFORMED
AMPHETAMINES: NOT DETECTED
BENZODIAZEPINES: NOT DETECTED
Barbiturates: NOT DETECTED
Cocaine: NOT DETECTED
Opiates: NOT DETECTED
Tetrahydrocannabinol: NOT DETECTED

## 2014-10-14 LAB — COMPREHENSIVE METABOLIC PANEL
ALK PHOS: 42 U/L (ref 38–126)
ALT: 46 U/L (ref 14–54)
ANION GAP: 12 (ref 5–15)
AST: 69 U/L — AB (ref 15–41)
Albumin: 4.7 g/dL (ref 3.5–5.0)
BUN: 12 mg/dL (ref 6–20)
CALCIUM: 9.7 mg/dL (ref 8.9–10.3)
CO2: 26 mmol/L (ref 22–32)
Chloride: 87 mmol/L — ABNORMAL LOW (ref 101–111)
Creatinine, Ser: 0.57 mg/dL (ref 0.44–1.00)
GFR calc non Af Amer: >60 mL/min (ref >60)
GLUCOSE: 116 mg/dL — AB (ref 65–99)
POTASSIUM: 4.9 mmol/L (ref 3.5–5.1)
SODIUM: 125 mmol/L — AB (ref 135–145)
TOTAL PROTEIN: 7.4 g/dL (ref 6.5–8.1)
Total Bilirubin: 0.5 mg/dL (ref 0.3–1.2)

## 2014-10-14 LAB — CBC
HEMATOCRIT: 30.2 % — AB (ref 36.0–46.0)
HEMOGLOBIN: 10.5 g/dL — AB (ref 12.0–15.0)
MCH: 32.5 pg (ref 26.0–34.0)
MCHC: 34.8 g/dL (ref 30.0–36.0)
MCV: 93.5 fL (ref 78.0–100.0)
Platelets: 307 10*3/uL (ref 150–400)
RBC: 3.23 MIL/uL — ABNORMAL LOW (ref 3.87–5.11)
RDW: 13.8 % (ref 11.5–15.5)
WBC: 6.7 10*3/uL (ref 4.0–10.5)

## 2014-10-14 LAB — URINALYSIS, ROUTINE W REFLEX MICROSCOPIC
BILIRUBIN URINE: NEGATIVE
Glucose, UA: NEGATIVE mg/dL
Ketones, ur: NEGATIVE mg/dL
Leukocytes, UA: NEGATIVE
Nitrite: NEGATIVE
Protein, ur: NEGATIVE mg/dL
SPECIFIC GRAVITY, URINE: 1.005 (ref 1.005–1.030)
Urobilinogen, UA: 0.2 mg/dL (ref 0.0–1.0)
pH: 8 (ref 5.0–8.0)

## 2014-10-14 LAB — ETHANOL

## 2014-10-14 LAB — PROLACTIN: Prolactin: 43.1 ng/mL — ABNORMAL HIGH (ref 4.8–23.3)

## 2014-10-14 LAB — URINE MICROSCOPIC-ADD ON

## 2014-10-14 MED ORDER — MIRTAZAPINE 15 MG PO TABS: 15.0000 mg | ORAL_TABLET | Freq: Every day | ORAL | Status: DC

## 2014-10-14 MED ORDER — CLONAZEPAM 0.5 MG PO TABS: 0.2500 mg | ORAL_TABLET | Freq: Three times a day (TID) | ORAL | Status: DC | PRN

## 2014-10-14 MED ORDER — HALOPERIDOL 5 MG PO TABS
2.5000 mg | ORAL_TABLET | Freq: Two times a day (BID) | ORAL | Status: DC
  Filled 2014-10-14 (×4): qty 15

## 2014-10-14 MED ORDER — LORATADINE 10 MG PO TABS: 10.0000 mg | ORAL_TABLET | Freq: Every day | ORAL | Status: DC

## 2014-10-14 MED ORDER — STERILE WATER FOR INJECTION IJ SOLN
INTRAMUSCULAR | Status: AC
  Administered 2014-10-14: 10 mL
  Filled 2014-10-14: qty 10

## 2014-10-14 MED ORDER — BENZTROPINE MESYLATE 0.5 MG PO TABS: 0.5000 mg | ORAL_TABLET | Freq: Two times a day (BID) | ORAL | Status: DC

## 2014-10-14 MED ORDER — ZIPRASIDONE MESYLATE 20 MG IM SOLR
10.0000 mg | Freq: Once | INTRAMUSCULAR | Status: AC
  Administered 2014-10-14: 10 mg via INTRAMUSCULAR
  Filled 2014-10-14: qty 20

## 2014-10-14 MED ORDER — LORAZEPAM 2 MG/ML IJ SOLN
2.0000 mg | Freq: Once | INTRAMUSCULAR | Status: AC
  Administered 2014-10-14: 2 mg via INTRAMUSCULAR
  Filled 2014-10-14: qty 1

## 2014-10-14 MED ORDER — SODIUM CHLORIDE 0.9 % IV SOLN
Freq: Once | INTRAVENOUS | Status: AC
  Administered 2014-10-14: via INTRAVENOUS

## 2014-10-14 MED ORDER — DIPHENHYDRAMINE HCL 50 MG/ML IJ SOLN
50.0000 mg | Freq: Once | INTRAMUSCULAR | Status: AC
  Administered 2014-10-14: 50 mg via INTRAMUSCULAR
  Filled 2014-10-14: qty 1

## 2014-10-14 MED ORDER — ADULT MULTIVITAMIN W/MINERALS CH: 1.0000 | ORAL_TABLET | Freq: Every day | ORAL | Status: DC

## 2014-10-14 MED ORDER — HALOPERIDOL 5 MG PO TABS: 2.5000 mg | ORAL_TABLET | Freq: Two times a day (BID) | ORAL | Status: DC

## 2014-10-14 MED ORDER — ZIPRASIDONE MESYLATE 20 MG IM SOLR
10.0000 mg | Freq: Once | INTRAMUSCULAR | Status: AC
Start: 2014-10-14 — End: 2014-10-14
  Administered 2014-10-14: 10 mg via INTRAMUSCULAR
  Filled 2014-10-14: qty 20

## 2014-10-14 MED ORDER — SERTRALINE HCL 100 MG PO TABS: 100.0000 mg | ORAL_TABLET | Freq: Every day | ORAL | Status: DC

## 2014-10-14 MED ORDER — SODIUM CHLORIDE 0.9 % IV BOLUS (SEPSIS)
1000.0000 mL | Freq: Once | INTRAVENOUS | Status: AC
  Administered 2014-10-14: 1000 mL via INTRAVENOUS

## 2014-10-14 NOTE — [facility] Suicide Risk Assessment (Signed)
[facility] Discharge Suicide Risk Assessment   Demographic Factors:  Caucasian  Total Time spent with patient: 30 minutes  Musculoskeletal: Strength & Muscle Tone: within normal limits Gait & Station: normal Patient leans: N/A  Psychiatric Specialty Exam: Physical Exam  Review of Systems  Psychiatric/Behavioral: Negative for depression and hallucinations.  All other systems reviewed and are negative.   Blood pressure 122/97, pulse 130, temperature 98.7 F (37.1 C), temperature source Oral, resp. rate 17, height 5\' 7"  (1.702 m), weight 39.463 kg (87 lb), SpO2 100 %.Body mass index is 13.62 kg/(m^2).  General Appearance: Casual  Eye Contact::  Fair  Speech:  Clear and [id number]  Volume:  Normal  Mood:  Euthymic  Affect:  Appropriate  Thought Process:  Coherent  Orientation:  Full (Time, Place, and Person)  Thought Content:  WDL  Suicidal Thoughts:  No  Homicidal Thoughts:  No  Memory:  Immediate;   Fair Recent;   Fair Remote;   Fair  Judgement:  Fair  Insight:  Fair  Psychomotor Activity:  Normal  Concentration:  Fair  Recall:  [employer] of Knowledge:Fair  Language: Fair  Akathisia:  No  Handed:  Right  AIMS (if indicated):     Assets:  Communication Skills Desire for Improvement  Sleep:  Number of Hours: 6.5  Cognition: WNL  ADL's:  Intact   Have you used any form of tobacco in the last 30 days? (Cigarettes, Smokeless Tobacco, Cigars, and/or Pipes): No  Has this patient used any form of tobacco in the last 30 days? (Cigarettes, Smokeless Tobacco, Cigars, and/or Pipes) Yes, A prescription for an FDA-approved tobacco cessation medication was offered at discharge and the patient refused  Mental Status Per Nursing Assessment::   On Admission:  NA  Current Mental Status by Physician: patient is pleasant, appears to be cheerful, pt denies any paranoia about her neighbor anymore, states she can deal with her. DeniesSI/HI/AH/VH.  Loss Factors: NA  Historical  Factors: Impulsivity  Risk Reduction Factors:   Positive social support  Continued Clinical Symptoms:  Previous Psychiatric Diagnoses and Treatments Medical Diagnoses and Treatments/Surgeries  Cognitive Features That Contribute To Risk:  Polarized thinking    Suicide Risk:  Minimal: No identifiable suicidal ideation.  Patients presenting with no risk factors but with morbid ruminations; may be classified as minimal risk based on the severity of the depressive symptoms  Principal Problem: Delusional disorder, persecutory type ( IMPROVED) Discharge Diagnoses:  Patient Active Problem List   Diagnosis Date Noted  . Hyperprolactinemia [E22.1] 10/14/2014  . GAD (generalized anxiety disorder) [F41.1] 10/13/2014  . Panic disorder [F41.0] 10/13/2014  . Delusional disorder, persecutory type [F22] 10/13/2014  . Benzodiazepine dependence [F13.20] 10/13/2014  . Asperger's disorder [F84.5] 10/13/2014  . Diarrhea [R19.7] 05/05/2014  . Mass of left lower leg [R22.42] 05/05/2014  . Anemia in chronic illness [D63.8] 04/08/2014  . 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 Splenic mass s/p lap splenectomy 03/31/2014 [R16.1] 03/31/2014  . Coagulopathy [D68.9] 10/04/2013  . Other pancytopenia [D61.818] 09/29/2013  . Bruising [T14.8] 09/29/2013  . Weight loss [R63.4] 09/29/2013  . PTSD (post-traumatic stress disorder) [F43.10] 09/29/2013  . Depression [F32.9] 09/29/2013      Plan Of Care/Follow-up recommendations:  Activity:  NO RESTRICTIONS Diet:  REGULAR Tests:  Follow up on Prolactin level - with Out patient provider Other:  Follow up with after care  Is patient on multiple antipsychotic therapies at discharge:  No   Has Patient had  three or more failed trials of antipsychotic monotherapy by history:  No  Recommended Plan for Multiple Antipsychotic Therapies: NA    Audryana Hockenberry MD 10/14/2014, 9:27 AM
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
## 2014-10-14 NOTE — [facility] Counselor (Signed)
TTS Counselor attempted to obtain collateral info from boyfriend [name] [phone]) via phone but received no answer. Pt is currently sedated; pt was given Geodon d/t aggression upon arrival to ED. Pt was just d/c from [facility] where she was admitted from 10/11/14 - 10/14/14 for psychosis and benzo dependence. LCSW spoke with boyfriend on 10/09/14 (prior to this hospitalization) and this info is included in current assessment.   Ramond Dial, [facility] Triage Specialist
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
## 2014-10-14 NOTE — Progress Notes (Signed)
  [facility] Adult Case Management Discharge Plan :  Will you be returning to the same living situation after discharge:  Yes,  home At discharge, do you have transportation home?: Yes,  bus pass Do you have the ability to pay for your medications: Yes,  MCD  Release of information consent forms completed and in the chart;  Patient's signature needed at discharge.  Patient to Follow up at: Follow-up Information    Follow up with [facility].   Why:  You can go for a walk-in apointment between 9 and 12 and 1 and 3, or call them to find out when to come in for an appointment again.  Make sure to thell them you need help with medication management.  Transitional Care team will give you a ride [phone].   Contact information:   [place] [id number]      Follow up with [facility] On 10/18/2014.   Why:  Tuesday at 3:45  The transitional care team will give you a ride.  Call them at [phone]   Contact information:   [place] [phone]      Patient denies SI/HI: Yes,  yes    Safety Planning and Suicide Prevention discussed: Yes,  yes  Have you used any form of tobacco in the last 30 days? (Cigarettes, Smokeless Tobacco, Cigars, and/or Pipes): No  Has patient been referred to the Quitline?: N/A patient is not a smoker  Mary Cooley, Mary Cooley 10/14/2014, 11:06 AM

## 2014-10-14 NOTE — ED Notes (Signed)
Bed: [facility] Expected date:  Expected time:  Means of arrival:  Comments: [place]

## 2014-10-14 NOTE — BHH Suicide Risk Assessment (Signed)
[place] INPATIENT:  Family/Significant Other Suicide Prevention Education  Suicide Prevention Education:  Education Completed; No one has been identified by the patient as the family member/significant other with whom the patient will be residing, and identified as the person(s) who will aid the patient in the event of a mental health crisis (suicidal ideations/suicide attempt).  With written consent from the patient, the family member/significant other has been provided the following suicide prevention education, prior to the and/or following the discharge of the patient.  The suicide prevention education provided includes the following:  Suicide risk factors  Suicide prevention and interventions  National Suicide Hotline telephone number  [facility] assessment telephone number  [facility] Emergency Assistance [facility] and/or Residential Mobile Crisis Unit telephone number  Request made of family/significant other to:  Remove weapons (e.g., guns, rifles, knives), all items previously/currently identified as safety concern.    Remove drugs/medications (over-the-counter, prescriptions, illicit drugs), all items previously/currently identified as a safety concern.  The family member/significant other verbalizes understanding of the suicide prevention education information provided.  The family member/significant other agrees to remove the items of safety concern listed above. The patient did not endorse SI at the time of admission, nor did the patient c/o SI during the stay here.  SPE not required.   Roque Lias B 10/14/2014, 11:06 AM
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
## 2014-10-14 NOTE — Progress Notes (Signed)
Discharged Note :Discharge instructions/medications/follow up appointments discussed with pt. Prescriptions given, samples given, and patients belongings returned to pt. Pt's caregiver Audelia Hives given bus passes and educated on pt's discharge follow up. Pt verbalizes understanding.Pt reported she knows how to handle her neighbor and will now be careful when crossing the street. Pt denies SI/HI/AVH.

## 2014-10-15 ENCOUNTER — Encounter (HOSPITAL_COMMUNITY): Payer: Self-pay | Admitting: *Deleted

## 2014-10-15 ENCOUNTER — Inpatient Hospital Stay (HOSPITAL_COMMUNITY)
Admission: AD | Admit: 2014-10-15 | Discharge: 2014-10-26 | DRG: 885 | Disposition: A | Discharge status: Home / Self Care | Payer: Medicaid Other | Source: Intra-hospital | Attending: Psychiatry | Admitting: Psychiatry

## 2014-10-15 DIAGNOSIS — E221 Hyperprolactinemia: Secondary | ICD-10-CM | POA: Diagnosis present

## 2014-10-15 DIAGNOSIS — F845 Asperger's syndrome: Secondary | ICD-10-CM | POA: Diagnosis present

## 2014-10-15 DIAGNOSIS — F1320 Sedative, hypnotic or anxiolytic dependence, uncomplicated: Secondary | ICD-10-CM | POA: Diagnosis present

## 2014-10-15 DIAGNOSIS — E43 Unspecified severe protein-calorie malnutrition: Secondary | ICD-10-CM | POA: Diagnosis present

## 2014-10-15 DIAGNOSIS — F209 Schizophrenia, unspecified: Secondary | ICD-10-CM | POA: Diagnosis not present

## 2014-10-15 DIAGNOSIS — F22 Delusional disorders: Secondary | ICD-10-CM | POA: Diagnosis present

## 2014-10-15 DIAGNOSIS — F411 Generalized anxiety disorder: Secondary | ICD-10-CM | POA: Diagnosis present

## 2014-10-15 DIAGNOSIS — Z681 Body mass index (BMI) 19 or less, adult: Secondary | ICD-10-CM | POA: Diagnosis not present

## 2014-10-15 DIAGNOSIS — F17210 Nicotine dependence, cigarettes, uncomplicated: Secondary | ICD-10-CM | POA: Diagnosis present

## 2014-10-15 DIAGNOSIS — F410 Panic disorder [episodic paroxysmal anxiety] without agoraphobia: Secondary | ICD-10-CM | POA: Diagnosis present

## 2014-10-15 LAB — HEMOGLOBIN A1C
Hgb A1c MFr Bld: 5.4 % (ref 4.8–5.6)
Mean Plasma Glucose: 108 mg/dL

## 2014-10-15 LAB — I-STAT CHEM 8, ED
BUN: 8 mg/dL (ref 6–20)
CALCIUM ION: 1.26 mmol/L — AB (ref 1.12–1.23)
CREATININE: 0.4 mg/dL — AB (ref 0.44–1.00)
Chloride: 100 mmol/L — ABNORMAL LOW (ref 101–111)
Glucose, Bld: 94 mg/dL (ref 65–99)
HCT: 36 % (ref 36.0–46.0)
Hemoglobin: 12.2 g/dL (ref 12.0–15.0)
POTASSIUM: 3.8 mmol/L (ref 3.5–5.1)
Sodium: 136 mmol/L (ref 135–145)
TCO2: 23 mmol/L (ref 0–100)

## 2014-10-15 MED ORDER — BENZTROPINE MESYLATE 0.5 MG PO TABS
0.5000 mg | ORAL_TABLET | Freq: Two times a day (BID) | ORAL | Status: DC
Start: 2014-10-15 — End: 2014-10-19
  Administered 2014-10-15 – 2014-10-19 (×8): 0.5 mg via ORAL
  Filled 2014-10-15 (×14): qty 1

## 2014-10-15 MED ORDER — CLONAZEPAM 0.5 MG PO TABS
0.2500 mg | ORAL_TABLET | Freq: Three times a day (TID) | ORAL | Status: DC | PRN
  Administered 2014-10-16 – 2014-10-21 (×7): 0.25 mg via ORAL
  Filled 2014-10-15 (×7): qty 1

## 2014-10-15 MED ORDER — ALUM & MAG HYDROXIDE-SIMETH 200-200-20 MG/5ML PO SUSP
30.0000 mL | ORAL | Status: DC | PRN
  Administered 2014-10-17: 30 mL via ORAL
  Filled 2014-10-15: qty 30

## 2014-10-15 MED ORDER — ACETAMINOPHEN 325 MG PO TABS
650.0000 mg | ORAL_TABLET | Freq: Four times a day (QID) | ORAL | Status: DC | PRN
  Administered 2014-10-16 – 2014-10-26 (×10): 650 mg via ORAL
  Filled 2014-10-15 (×10): qty 2

## 2014-10-15 MED ORDER — HALOPERIDOL 2 MG PO TABS
2.5000 mg | ORAL_TABLET | Freq: Two times a day (BID) | ORAL | Status: DC
  Administered 2014-10-15 – 2014-10-16 (×2): 2.5 mg via ORAL
  Filled 2014-10-15 (×10): qty 1

## 2014-10-15 MED ORDER — SERTRALINE HCL 100 MG PO TABS
100.0000 mg | ORAL_TABLET | Freq: Every day | ORAL | Status: DC
  Administered 2014-10-16 – 2014-10-22 (×7): 100 mg via ORAL
  Filled 2014-10-15 (×9): qty 1

## 2014-10-15 MED ORDER — MIRTAZAPINE 15 MG PO TABS
15.0000 mg | ORAL_TABLET | Freq: Every day | ORAL | Status: DC
  Administered 2014-10-15 – 2014-10-19 (×5): 15 mg via ORAL
  Filled 2014-10-15 (×7): qty 1

## 2014-10-15 MED ORDER — MAGNESIUM HYDROXIDE 400 MG/5ML PO SUSP: 30.0000 mL | Freq: Every day | ORAL | Status: DC | PRN
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
## 2014-10-15 NOTE — ED Notes (Signed)
Called report to [place], RN in [place] pt moved to room 28.
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
## 2014-10-15 NOTE — BHH Counselor (Signed)
Disposition: Per [name], NP pt meets inpt tx criteria and can come to [facility]. Per [name], [facility], pt needs to be observed overnight in the ED due to acuity. Possible placement at [facility] later this morning as long as pt is stable.    Ramond Dial, [facility] Triage Specialist

## 2014-10-15 NOTE — Progress Notes (Signed)
D: Patient denies SI/HI; patient denies A/V hallucinations; patient reports that she needed a place that she felt safe  A: Monitored q 15 minutes; patient educated about medications; patient given medications per physician orders; patient encouraged to express feelings and/or concerns  R: Patient is cooperative and pleasant; patient's interaction with staff and peers is assertive; patient was able to set goal to talk with staff 1:1 when having feelings of SI; patient is very interactive and appears to be comfortable on the unit

## 2014-10-15 NOTE — BHH Counselor (Signed)
Patient has been accepted to [facility] 782-9 Dr Shea Evans attending call [phone] for report.    Rosalin Hawking, LCSW Therapeutic Triage Specialist [place] 10/15/2014 11:21 AM

## 2014-10-15 NOTE — Tx Team (Addendum)
Initial Interdisciplinary Treatment Plan   PATIENT STRESSORS: Traumatic event   PATIENT STRENGTHS: Ability for insight General fund of knowledge   PROBLEM LIST: Problem List/Patient Goals Date to be addressed Date deferred Reason deferred Estimated date of resolution  depression 10/15/14     psychosis 10/18/2014     anxiety 10/18/2014     "I want to get well and feel better about myself" 10/18/2014                                    DISCHARGE CRITERIA:  Improved stabilization in mood, thinking, and/or behavior Need for constant or close observation no longer present  PRELIMINARY DISCHARGE PLAN: Return to previous living arrangement  PATIENT/FAMIILY INVOLVEMENT: This treatment plan has been presented to and reviewed with the patient, Tylor Gambrill, and/or family member.  The patient and family have been given the opportunity to ask questions and make suggestions.  Benancio Deeds Shanta 10/15/2014, 3:55 PM

## 2014-10-16 ENCOUNTER — Encounter (HOSPITAL_COMMUNITY): Payer: Self-pay | Admitting: Psychiatry

## 2014-10-16 DIAGNOSIS — F22 Delusional disorders: Secondary | ICD-10-CM

## 2014-10-16 MED ORDER — HALOPERIDOL 5 MG PO TABS
5.0000 mg | ORAL_TABLET | Freq: Every day | ORAL | Status: DC
  Administered 2014-10-16 – 2014-10-17 (×2): 5 mg via ORAL
  Filled 2014-10-16 (×4): qty 1

## 2014-10-16 MED ORDER — HALOPERIDOL 2 MG PO TABS
2.5000 mg | ORAL_TABLET | Freq: Every morning | ORAL | Status: DC
  Administered 2014-10-17 – 2014-10-18 (×2): 2.5 mg via ORAL
  Filled 2014-10-16 (×5): qty 1

## 2014-10-16 NOTE — Progress Notes (Signed)
Patient is becoming increasingly confused with disorganized thoughts. Easily redirected.  States "I'm trying to loose some weight by drinking water." PRN anxiety medications given.

## 2014-10-16 NOTE — BHH Group Notes (Signed)
[place] Group Notes: (Clinical Social Work)  10/16/2014 11:00AM-12:00PM  Summary of Progress/Problems: The main focus of today's process group was to listen to a variety of genres of music and to identify that different types of music provoke different responses. The patient then was able to identify personally what was soothing or energizing for them, as well as how to use this knowledge in sleep habits, with depression, for voices, and with other symptoms. The patient expressed understanding of concepts, as well as knowledge of how each type of music affected her and how this can be used at home as a wellness/recovery tool. She was somewhat intrusive at times, but then was able to explain what she was interrupting for.  Type of Therapy: Music Therapy   Participation Level: Active  Participation Quality: Attentive and Sharing  Affect: Flat  Cognitive: Disorganized  Insight: Improving  Engagement in Therapy: Developing  Modes of Intervention: Activity, Exploration  Selmer Dominion, LCSW 10/16/2014  12:31 PM

## 2014-10-16 NOTE — Progress Notes (Signed)
Adult Psychoeducational Group Note  Date:  10/16/2014 Time:  8:59 PM  Group Topic/Focus:  Wrap-Up Group:   The focus of this group is to help patients review their daily goal of treatment and discuss progress on daily workbooks.  Participation Level:  Active  Participation Quality:  Appropriate  Affect:  Appropriate  Cognitive:  Appropriate  Insight: Appropriate  Engagement in Group:  Engaged  Modes of Intervention:  Discussion  Additional Comments: The patient expressed that her started out bad but rated a 7 as the day went on.The patient also said that she attend music therapy group.  Nash Shearer 10/16/2014, 8:59 PM
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
## 2014-10-16 NOTE — Progress Notes (Signed)
D- Patient is out in milieu interacting with peers and attending groups. Her thought process is organized, though is is focused on her SO and contacting him.  No physical complaints and no prn medication requested.  She reports "poor" sleep last night, but attributed this to "sleep walking"ASSESSMENT:- Support and encouragement given,  Cont current POC and evaluation of treatment goals.  Cont 15' checks for safety. R- Safety maintained.
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
## 2014-10-16 NOTE — [facility] Group Notes (Signed)
[facility] Group Notes:  Healthy support systems  Date:  10/16/2014  Time:  11:24 AM  Type of Therapy:  Group Therapy  Participation Level:  Active  Participation Quality:  Appropriate  Affect:  Appropriate  Cognitive:  Appropriate  Insight:  Appropriate  Engagement in Group:  Engaged  Modes of Intervention:  Discussion  Summary of Progress/Problems:  Delman Kitten 10/16/2014, 11:24 AM

## 2014-10-17 ENCOUNTER — Other Ambulatory Visit: Payer: Self-pay

## 2014-10-17 DIAGNOSIS — F411 Generalized anxiety disorder: Secondary | ICD-10-CM

## 2014-10-17 DIAGNOSIS — F1320 Sedative, hypnotic or anxiolytic dependence, uncomplicated: Secondary | ICD-10-CM

## 2014-10-17 DIAGNOSIS — F410 Panic disorder [episodic paroxysmal anxiety] without agoraphobia: Secondary | ICD-10-CM

## 2014-10-17 DIAGNOSIS — E221 Hyperprolactinemia: Secondary | ICD-10-CM

## 2014-10-17 DIAGNOSIS — F845 Asperger's syndrome: Secondary | ICD-10-CM

## 2014-10-17 LAB — VITAMIN B12: Vitamin B-12: 330 pg/mL (ref 180–914)

## 2014-10-17 LAB — FOLATE: Folate: 20.2 ng/mL (ref >5.9)

## 2014-10-17 MED ORDER — ADULT MULTIVITAMIN W/MINERALS CH
1.0000 | ORAL_TABLET | Freq: Every day | ORAL | Status: DC
  Administered 2014-10-17: 1 via ORAL
  Filled 2014-10-17 (×3): qty 1

## 2014-10-17 MED ORDER — ENSURE ENLIVE PO LIQD
237.0000 mL | Freq: Four times a day (QID) | ORAL | Status: DC
  Administered 2014-10-17 – 2014-10-26 (×27): 237 mL via ORAL

## 2014-10-17 MED ORDER — PRENATAL MULTIVITAMIN CH
1.0000 | ORAL_TABLET | Freq: Every day | ORAL | Status: DC
Start: 2014-10-17 — End: 2014-10-17
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
## 2014-10-17 NOTE — Progress Notes (Signed)
The focus of this group is to help patients review their daily goal of treatment and discuss progress on daily workbooks. Pt attended the evening group session and responded to all discussion prompts from the [place]. Pt shared that today was a partly good, partly bad day on the unit. The highlight of today was a visit from her boyfriend, though she was disappointed to still be here. "I hope I'm discharged soon so I can go home to my boyfriend and our kitty cats." Murry reported having no additional needs from Nursing Staff this evening. Pt appeared anxious and distracted in group, staring down into her lap when not speaking/being spoken to.

## 2014-10-17 NOTE — Plan of Care (Signed)
Problem: Alteration in thought process Goal: STG-Patient does not respond to command hallucinations Outcome: Progressing Pt remains safe and denies AVH
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
## 2014-10-17 NOTE — Plan of Care (Signed)
Problem: Alteration in thought process Goal: LTG-Patient verbalizes understanding importance med regimen (Patient verbalizes understanding of importance of medication regimen and need to continue outpatient care.)  Outcome: Progressing Reviewed her medications during morning med pass.  She was able to verbalize that she needs to take medication but is still somewhat hesitant to take medications for fear that they may cause her to be more nauseated.

## 2014-10-17 NOTE — Progress Notes (Signed)
Patient ID: [facility], female   DOB: 10-29-1985, 30 y.o.   MRN: [id number] D: Patient in room on approach. Pt mood and affect appeared anxious. Pt reports she felt lightheaded the day before as a result she has been eating and tolerating it well. Pt denies SI/HI/AVH and pain. Pt attended and participated in evening wrap up group. Cooperative with assessment. No acute distressed noted at this time.   A: Met with pt 1:1. Medications administered as prescribed. Support and encouragement provided. Pt encouraged to discuss feelings and come to staff with any question or concerns.   R: Patient remains safe and complaint with medications.
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
## 2014-10-17 NOTE — BHH Group Notes (Signed)
[place] LCSW Group Therapy  10/17/2014 5:18 PM   Type of Therapy:  Group Therapy  Participation Level:  Active  Participation Quality:  Attentive  Affect:  Appropriate  Cognitive:  Appropriate  Insight:  Improving  Engagement in Therapy:  Engaged  Modes of Intervention:  Clarification, Education, Exploration and Socialization  Summary of Progress/Problems: Today's group focused on relapse prevention.  We defined the term, and then brainstormed on ways to prevent relapse.  Came to group.  [facility] it was too cold despite the fact that the HVAC had been adjusted.  Left and did not return.  [facility] 10/17/2014 , 5:18 PM

## 2014-10-17 NOTE — Progress Notes (Signed)
NUTRITION ASSESSMENT  Pt identified as at risk on the Malnutrition Screen Tool  INTERVENTION: 1. Educated patient on the importance of nutrition and encouraged intake of food, beverages and supplements. 2. Supplements: Ensure Enlive po QID, each supplement provides 350 kcal and 20 grams of protein 3. Provide "[place]" handout, provides information on free meals available in [place] area.  NUTRITION DIAGNOSIS: Unintentional weight loss related to sub-optimal intake as evidenced by pt report.   Goal: Pt to meet >/= 90% of their estimated nutrition needs.  Monitor:  PO intake  Assessment:  Pt admitted with anxiety, panic disorder, and delusional disorder. RD saw pt during previous admission (8/10), pt questionable to have anorexia. Per RN, pt stated that she was trying to lose weight by drinking water. However, she is eating better this time and is drinking her Ensures.   Provided "[place] [employer]" handout, provides information on free meals available in [place] area. Pt stated she appreciated the information even if it doesn't benefit her.  RD ordered Ensure QID.  Pt is underweight. Pt meets criteria for severe MALNUTRITION in the context of social/environmental circumstances as evidenced by severe fat and muscle depletion and 12% weight loss x 7 months.  Height: Ht Readings from Last 1 Encounters:  10/15/14 5\' 9"  (1.753 m)    Weight: Wt Readings from Last 1 Encounters:  10/15/14 100 lb (45.36 kg)    Weight Hx: Wt Readings from Last 10 Encounters:  10/15/14 100 lb (45.36 kg)  10/11/14 87 lb (39.463 kg)  05/05/14 110 lb 14.4 oz (50.304 kg)  04/07/14 109 lb 3.2 oz (49.533 kg)  03/31/14 114 lb (51.71 kg)  03/22/14 114 lb 3.2 oz (51.801 kg)  10/28/13 120 lb (54.432 kg)  10/18/13 121 lb 11.2 oz (55.203 kg)  10/04/13 121 lb 14.4 oz (55.293 kg)  09/29/13 121 lb 14.4 oz (55.293 kg)    BMI:  Body mass index is 14.76 kg/(m^2). Pt meets criteria for  underweight based on current BMI.  Estimated Nutritional Needs: Kcal: 25-30 kcal/kg Protein: > 1 gram protein/kg Fluid: 1 ml/kcal  Diet Order: Diet regular Room service appropriate?: Yes; Fluid consistency:: Thin Pt is also offered choice of unit snacks mid-morning and mid-afternoon.  Pt is eating as desired.   Lab results and medications reviewed.   Clayton Bibles, MS, RD, LDN Pager: [phone] After Hours Pager: [phone]
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
## 2014-10-17 NOTE — Progress Notes (Signed)
DAR Note: Mary Cooley has been visible on the unit.  She has been wandering around the unit and in and out of her room.  She reports that she has been having some anxiety but stated that she will feel better after speaking to her boyfriend, Mary Cooley.  Per her daily self inventory, she reports her depression a 2/10, hopelessness 2/10 and her anxiety 3/10.  Her goal is to work her her discharge plan with social worker today and wants [place] to make sure that she will be able to get a cab voucher when she is discharged.  She did go to the cafeteria for lunch today but was unable to eat because there was too much noise for her to concentrate.  She did drink her ensures that were ordered today and had some indigestion which maalox has mildly helpful.  She continues to talk about her cats and wanting to see her boyfriend Mary Cooley.  She states that she hasn't been feeling well and doesn't feel like her medications are being helpful.  Her pulse rate was 127 early this morning and recheck at AM med pass was 120.  Informed MD and EKG was ordered.  She was able to sit through the EKG without difficulty.  Her pulse rate was 115 at that time.  Recheck of her pulse late this afternoon was 98.  She is reporting more anxiety and is constantly trying to call her boyfriend Mary Cooley.  She attended groups on the unit but she was in and out during the group.  She has been working on Freight forwarder and Sudoku's this afternoon to keep busy.  She continued to need frequent reassurance from staff.  She is hoping to go to the cafeteria for supper and wants a "vegetarian plate" but states that she will let staff know if we should just bring back a plate from the cafeteria.  Q 15 minute safety checks maintained on the unit.

## 2014-10-17 NOTE — Progress Notes (Signed)
Patient ID: Mary Cooley, female   DOB: [birth year], 29 y.o.   MRN: [id number] D: Patient reports she had a good because boyfriend visited for a short while. Pt rated her overall day as 7 on 0-10 scale. Pt needed redirection through out the evening to maintain control. Pt denies SI/HI/AVH and pain. Pt attended and participated in evening wrap up group. Cooperative with assessment. No acute distressed noted at this time.   A: Met with pt 1:1. Medications administered as prescribed. Support and encouragement provided. Pt encouraged to discuss feelings and come to staff with any question or concerns.   R: Patient remains safe and complaint with medications.
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
## 2014-10-18 DIAGNOSIS — E43 Unspecified severe protein-calorie malnutrition: Secondary | ICD-10-CM | POA: Clinically undetermined

## 2014-10-18 MED ORDER — HALOPERIDOL 5 MG PO TABS
7.5000 mg | ORAL_TABLET | Freq: Every day | ORAL | Status: DC
  Administered 2014-10-18: 7.5 mg via ORAL
  Filled 2014-10-18 (×3): qty 1

## 2014-10-18 MED ORDER — CYANOCOBALAMIN 500 MCG PO TABS
500.0000 ug | ORAL_TABLET | Freq: Every day | ORAL | Status: DC
  Administered 2014-10-18 – 2014-10-26 (×9): 500 ug via ORAL
  Filled 2014-10-18 (×14): qty 1

## 2014-10-18 NOTE — Plan of Care (Deleted)
Problem: Alteration in thought process Goal: LTG-Patient behavior demonstrates decreased signs psychosis (Patient behavior demonstrates decreased signs of psychosis to the point the patient is safe to return home and continue treatment in an outpatient setting.)  Outcome: Progressing Pt was able to hold a 5 minute logical conversation with writer this AM. Appears less confused, oriented to self, place and time when assessed this AM. Safety maintained on Q 15 minutes checks as ordered without outburst or self injurious behavior to note at this time.

## 2014-10-18 NOTE — Progress Notes (Addendum)
Patient ID: Mary Cooley, female   DOB: September 13, 1985, 29 y.o.   MRN: [id number] D: Patient reports she had a good day because boyfriend visited for her birthday. Pt has been in her room sleeping. Pt denies SI/HI/AVH and pain. Pt attended and participated in evening wrap up group. Cooperative with assessment. No acute distressed noted at this time.  A: Met with pt 1:1. Medications administered as prescribed. Support and encouragement provided. Pt encouraged to discuss feelings and come to staff with any question or concerns.  R: Patient remains safe and complaint with medications.

## 2014-10-18 NOTE — Progress Notes (Signed)
The focus of this group is to help patients review their daily goal of treatment and discuss progress on daily workbooks. Pt did not attend the evening group. 

## 2014-10-18 NOTE — Progress Notes (Signed)
D: Pt visible in milieu for majority of this shift. Observed to be restless, pacing in hall and very intrusive towards peers this evening. Verbal redirection was ineffective at the time. Pt denied SI, HI, AVH and pain when assessed.  Pt attended groups on unit.  A: 1:1 contact made with pt to conduct shift assessment and offer support. All medications including PRN Klonopin 0.25 mg PO for anxiety administered as per MD's orders. Pt encouraged to voice needs. Remains on Q 15 minutes checks without harm to self or others thus far this shift.  R: Pt calm when reassessed. Visiting with boyfriend [name] at present. Plan of care continues.
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
## 2014-10-18 NOTE — Plan of Care (Signed)
Problem: Alteration in thought process Goal: STG-Patient is able to follow short directions Outcome: Progressing Pt able to follow direction but need to be redirected

## 2014-10-18 NOTE — Plan of Care (Signed)
Problem: Alteration in thought process Goal: LTG-Patient has not harmed self or others in at least 2 days Outcome: Progressing Pt maintained on Q15 minutes checks as ordered for safety without gestures or event of self injurious behavior to note X 2 days.

## 2014-10-18 NOTE — BHH Group Notes (Signed)
[facility] LCSW Group Therapy  10/18/2014 , 1:18 PM   Type of Therapy:  Group Therapy  Participation Level:  Active  Participation Quality:  Attentive  Affect:  Appropriate  Cognitive:  Alert  Insight:  Improving  Engagement in Therapy:  Engaged  Modes of Intervention:  Discussion, Exploration and Socialization  Summary of Progress/Problems: Today's group focused on the term Diagnosis.  Participants were asked to define the term, and then pronounce whether it is a negative, positive or neutral term.  In and out of group several times.  Attempted to interject a couple of times when present, but was difficult to be heard over the other louder, more assertive members.  Mary Cooley B 10/18/2014 , 1:18 PM

## 2014-10-18 NOTE — Progress Notes (Signed)
  [facility] Adult Case Management Discharge Plan :  Will you be returning to the same living situation after discharge:  Yes,  home At discharge, do you have transportation home?: Yes,  cab voucher' Do you have the ability to pay for your medications: Yes,  MCD  Release of information consent forms completed and in the chart;  Patient's signature needed at discharge.  Patient to Follow up at: Follow-up Information    Follow up with [facility] [place].   Why:  You can go for a walk-in apointment between 9 and 12 and 1 and 3, or call them to find out when to come in for an appointment again.  Make sure to thell them you need help with medication management.  Transitional Care team will give you a ride [phone].   Contact information:   [place] [address]      Follow up with [facility] On 10/18/2014.   Why:  Tuesday at 3:45  The transitional care team will give you a ride.  Call them at [phone]   Contact information:   [place] [phone]      Patient denies SI/HI: Yes,  yes    Safety Planning and Suicide Prevention discussed: Yes,  yes  Have you used any form of tobacco in the last 30 days? (Cigarettes, Smokeless Tobacco, Cigars, and/or Pipes): No  Has patient been referred to the Quitline?: N/A patient is not a smoker  [place] B 10/18/2014, 1:39 PM

## 2014-10-18 NOTE — BHH Group Notes (Addendum)
[place] Group Notes:  (Nursing/Recovery)  Date:  10/18/2014  Time:  0930  Type of Therapy:  Nurse Education  Participation Level:  Active  Participation Quality:  Appropriate and Attentive  Affect:  Anxious  Cognitive:  Alert and Appropriate  Insight:  Limited  Engagement in Group:  Engaged  Modes of Intervention:  Discussion, Education, Orientation and Support  Summary of Progress/Problems:Pt attended group and was engaged throughout this session.   Meryle Ready, Nicoletta Dress 10/18/2014, 0930
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
## 2014-10-19 MED ORDER — BENZTROPINE MESYLATE 0.5 MG PO TABS
0.5000 mg | ORAL_TABLET | Freq: Every day | ORAL | Status: DC
  Administered 2014-10-19 – 2014-10-25 (×7): 0.5 mg via ORAL
  Filled 2014-10-19: qty 1
  Filled 2014-10-19: qty 3
  Filled 2014-10-19 (×3): qty 1
  Filled 2014-10-19: qty 3
  Filled 2014-10-19: qty 1
  Filled 2014-10-19: qty 3
  Filled 2014-10-19 (×2): qty 1
  Filled 2014-10-19: qty 3
  Filled 2014-10-19: qty 1

## 2014-10-19 MED ORDER — OLANZAPINE 5 MG PO TBDP
5.0000 mg | ORAL_TABLET | Freq: Every day | ORAL | Status: DC
  Administered 2014-10-19: 5 mg via ORAL
  Filled 2014-10-19 (×2): qty 1

## 2014-10-19 MED ORDER — PROPRANOLOL HCL 20 MG PO TABS
20.0000 mg | ORAL_TABLET | Freq: Two times a day (BID) | ORAL | Status: DC
  Administered 2014-10-19 – 2014-10-26 (×14): 20 mg via ORAL
  Filled 2014-10-19: qty 1
  Filled 2014-10-19: qty 6
  Filled 2014-10-19 (×8): qty 1
  Filled 2014-10-19 (×2): qty 6
  Filled 2014-10-19: qty 2
  Filled 2014-10-19: qty 6
  Filled 2014-10-19 (×6): qty 1
  Filled 2014-10-19 (×3): qty 6
  Filled 2014-10-19: qty 1
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
## 2014-10-19 NOTE — BHH Group Notes (Signed)
[facility] Mental Health Association Group Therapy  10/19/2014 , 2:15 PM    Type of Therapy:  Mental Health Association Presentation  Participation Level:  Active  Participation Quality:  Attentive  Affect:  Blunted  Cognitive:  Oriented  Insight:  Limited  Engagement in Therapy:  Engaged  Modes of Intervention:  Discussion, Education and Socialization  Summary of Progress/Problems:  Shanon Brow from [facility] came to present his recovery story and play the guitar.  Came briefly.  "I don't need any music therapy."  Left and did not return.  Roque Lias B 10/19/2014 , 2:15 PM

## 2014-10-19 NOTE — Progress Notes (Signed)
D:  Per pt self inventory pt reports sleeping good, appetite fair, energy level normal, ability to pay attention good, rates depression at a 1 out of 10, hopelessness at a 2 out of 10, anxiety at a 2 out of 10, denies SI/HI/AVH, paranoid/anxious, goal today: "Seeing [place] maybe doing some art, keeping focus, talking to Dr's like Dr. Alejandro Mulling"      A:  Emotional support provided, Encouraged pt to continue with treatment plan and attend all group activities, q15 min checks maintained for safety.  R:  Pt is going to groups, anxious during interaction, visible in the milieu, pleasant and cooperative with staff and other patients.

## 2014-10-19 NOTE — Progress Notes (Signed)
Adult Psychoeducational Group Note  Date:  10/19/2014 Time:  9:46 PM  Group Topic/Focus:  Wrap-Up Group:   The focus of this group is to help patients review their daily goal of treatment and discuss progress on daily workbooks.  Participation Level:  Active  Participation Quality:  Appropriate  Affect:  Appropriate  Cognitive:  Appropriate  Insight: Appropriate  Engagement in Group:  Engaged  Modes of Intervention:  Discussion  Additional Comments: The patient expressed that she attended music therapy.  Nash Shearer 10/19/2014, 9:46 PM

## 2014-10-20 MED ORDER — OLANZAPINE 5 MG PO TBDP
7.5000 mg | ORAL_TABLET | Freq: Every day | ORAL | Status: DC
  Administered 2014-10-20 – 2014-10-21 (×2): 7.5 mg via ORAL
  Filled 2014-10-20 (×4): qty 1.5

## 2014-10-20 MED ORDER — MIRTAZAPINE 7.5 MG PO TABS
7.5000 mg | ORAL_TABLET | Freq: Every day | ORAL | Status: DC
  Administered 2014-10-20 – 2014-10-25 (×6): 7.5 mg via ORAL
  Filled 2014-10-20: qty 1
  Filled 2014-10-20: qty 3
  Filled 2014-10-20: qty 1
  Filled 2014-10-20: qty 3
  Filled 2014-10-20 (×4): qty 1
  Filled 2014-10-20: qty 3
  Filled 2014-10-20: qty 1
  Filled 2014-10-20: qty 3

## 2014-10-20 NOTE — ED Notes (Signed)
This RN accessed the Pt's chart to obtain contact information, due to personal belongings being found.  This RN confirmed that the Pt is at [facility] and will give belongings to security.

## 2014-10-20 NOTE — Progress Notes (Signed)
D: Patient restless, needy and needing frequent redirection this shift. Pt intrusive and interrupting RN's conversations with others and assessments. Pt did not attend group session. Pt noted to have food items on her floor in her room, as well as clothes and trash. Staff instructed pt to clean area up, which she was unable to complete. A: Q 15 minute safety checks, encourage staff/peer interactions, administer medications as ordered by MD. Encourage group session participation. R: Pt compliant with medications, but continues to be intrusive to staff as well as peers.

## 2014-10-20 NOTE — Progress Notes (Addendum)
Patient awake, turning all of her lights on, waking up her roommate. Pt going through her paper bags repeatedly, making loud noises and opening and closing her bathroom door several times waking others on the unit. RN entered room to redirect patient back to bed. As this RN turned light switch off, pt lightly slapped nurse's hand stating "Stop! Leave that alone!", then placing her hands over light switch to prevent staff from turning off lights. RN informed pt that she was waking her roommate; pt responded "So". As RN walked out of her room, pt again turned overhead lights on. Pt then began to pour her soda back and forth between two cups, making more noise and disturbing her roommate. Pt earlier in the evening had been offered and had tried to sleep in the open seclusion room, which she entered and exited multiple times. Cori, NP notified of behaviors; an order for no roommate received.

## 2014-10-20 NOTE — Progress Notes (Signed)
D:  Per pt self inventory pt reports sleeping good, appetite fair, energy level normal, ability to pay attention good, rates depression at a 1 out of 10, hopelessness at a 2 out of 10, anxiety at a 1 out of 10, denies SI/HI/AVH, goal today: "colored a mandala and got extras for home coloring, work on relaxing, seeing and speaking to [place]"  A:  Emotional support provided, Encouraged pt to continue with treatment plan and attend all group activities, q15 min checks maintained for safety.  R:  Pt is going to groups, however she did not stay for the entire group, pt is talkative and preoccupied with discharging, pleasant and cooperative with staff and other patients, tangential at times during interaction.
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
## 2014-10-20 NOTE — BHH Group Notes (Signed)
Adult Psychoeducational Group Note  Date:  10/20/2014 Time:  0900am  Group Topic/Focus:  Goals Group:   The focus of this group is to help patients establish daily goals to achieve during treatment and discuss how the patient can incorporate goal setting into their daily lives to aide in recovery. Orientation:   The focus of this group is to educate the patient on the purpose and policies of crisis stabilization and provide a format to answer questions about their admission.  The group details unit policies and expectations of patients while admitted.  Participation Level:  Minimal  Participation Quality:  Attentive and Sharing  Affect:  Anxious  Cognitive:  Alert and Appropriate  Insight: Lacking  Engagement in Group:  Engaged  Modes of Intervention:  Discussion, Education and Orientation  Dola Factor 10/20/2014, 1:18 PM

## 2014-10-20 NOTE — BHH Group Notes (Signed)
[facility] Group Notes:  (Counselor/Nursing/MHT/Case Management/Adjunct)  10/20/2014 1:15PM  Type of Therapy:  Group Therapy  Participation Level:  Active  Participation Quality:  Appropriate  Affect:  Flat  Cognitive:  Oriented  Insight:  Improving  Engagement in Group:  Limited  Engagement in Therapy:  Limited  Modes of Intervention:  Discussion, Exploration and Socialization  Summary of Progress/Problems: The topic for group was balance in life.  Pt participated in the discussion about when their life was in balance and out of balance and how this feels.  Pt discussed ways to get back in balance and short term goals they can work on to get where they want to be. Stayed for the first half of group.  Got frustrated that her pencil broke with which she was coloring non-stop during her time in group, and so left.  She shared that thinking of her cats and [name] are what is keep her balanced, and encouraged group members to help others as a way of receiving.  Also stated that it's OK for others to find their way in the manner that works for them, and that we don't have to all be the same.  Emphasized the importance of dropping judgment.  Instrusive at times.  When she has a point to make, does not take kindly to being cut off.   Trish Mage 10/20/2014 3:39 PM

## 2014-10-21 LAB — C DIFFICILE QUICK SCREEN W PCR REFLEX
C DIFFICILE (CDIFF) TOXIN: NEGATIVE
C DIFFICLE (CDIFF) ANTIGEN: NEGATIVE
C Diff interpretation: NEGATIVE

## 2014-10-21 MED ORDER — HYDROXYZINE HCL 50 MG PO TABS
50.0000 mg | ORAL_TABLET | Freq: Three times a day (TID) | ORAL | Status: DC | PRN
  Administered 2014-10-21: 50 mg via ORAL
  Filled 2014-10-21: qty 1

## 2014-10-21 MED ORDER — OLANZAPINE 5 MG PO TBDP
2.5000 mg | ORAL_TABLET | Freq: Every day | ORAL | Status: DC
  Administered 2014-10-21: 2.5 mg via ORAL
  Filled 2014-10-21 (×3): qty 0.5

## 2014-10-21 NOTE — Progress Notes (Signed)
[facility] Group Notes:  (Nursing/MHT/Case Management/Adjunct)  Date:  10/21/2014  Time:  9:27 PM  Type of Therapy:  Psychoeducational Skills  Participation Level:  Active  Participation Quality:  Appropriate  Affect:  Depressed  Cognitive:  Disorganized  Insight:  Improving  Engagement in Group:  Distracting and Off Topic  Modes of Intervention:  Education  Summary of Progress/Problems: The patient arrived late for group and had to be redirected in order to stay on topic. Patient admits to having had an "up and down" day and that she sometimes misinterprets things due to having low self-esteem. She spent some time coloring in her bedroom today. As a theme for the day, her relapse prevention will include allowing her boyfriend to give her medication and to help her with using the telephone. She admits to missing her boyfriend as well as her pet.   Archie Balboa S 10/21/2014, 9:27 PM

## 2014-10-21 NOTE — B[facility] Group Notes (Signed)
[facility] LCSW Group Therapy  10/21/2014  1:05 PM  Type of Therapy:  Group therapy  Participation Level:  Active  Participation Quality:  Attentive  Affect:  Flat  Cognitive:  Oriented  Insight:  Limited  Engagement in Therapy:  Limited  Modes of Intervention:  Discussion, Socialization  Summary of Progress/Problems:  Chaplain was here to lead a group on themes of hope and courage. In and out of group several times.  No meaningful contribution to the group.  Sat and made gestures of circles with her right arm and hand for 5 minutes or so.  Eventually was called on as she indicated she was raising her hand.  Talked about [place]om [place]ippur and how it is about seeking forgiveness. Roque Lias B 10/21/2014 2:21 PM
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
## 2014-10-21 NOTE — Progress Notes (Signed)
D: Pt denies SI/HI/AVH. Pt is pleasant and cooperative. Writer tried to talk with pt, pt stated i was in the middle of something. Pt said she wanted to finish coloring. Pt very labile , moody and tangential. Pt gets hyper-verbal at times. Pt stated she was anxious today. Pt very worried, concerned about various things. Pt has flight of ideas and constantly talk about cats/ Ruben. Pt pre-occupied with whatever is on her mind, and that seems to be her only focus.   A: Pt was offered support and encouragement. Pt was given scheduled medications. Pt was encourage to attend groups. Q 15 minute checks were done for safety.    R:Pt attends groups and interacts well with peers and staff. Pt is taking medication.Pt receptive to treatment and safety maintained on unit.
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
## 2014-10-21 NOTE — Progress Notes (Signed)
D:  Per pt self inventory pt reports sleeping fair, appetite fair, energy level normal, rates depression at a 2 out of 10, hopelessness at a 2 out of 10, anxiety at a 3 out of 10, tearful this am--emotional support provided, pt's mood brightened as day has progressed, denies SI/HI/AVH, goal today:  "Happy thoughts for 6 hours straight, color when my body warms up"  A:  Emotional support provided, Encouraged pt to continue with treatment plan and attend all group activities, q15 min checks maintained for safety.  R:  Pt is receptive, going to groups, bizarre affect, pleasant and cooperative with staff, can be intrusive with other patients and needs some redirection at times.

## 2014-10-21 NOTE — Progress Notes (Signed)
Pt has been increasingly labile, having crying spells and agitation then smiling and trying to interact in milieu. Pt is intrusive and hyperverbal. Thought process tangential with racing thoughts. Pt thinks that other pts are ridiculing her about her religion, and calling her stupid. Pt stated that she "is stupid and needs to be punished like i was when i was little". Pt then put her hands on the wall and squatted deeply removing her hands as in sitting "in a chair". Writer spoke with pt 1:1 and pt was able to calm down and within minutes she was  Back at nurses station talking about coloring sheets and her cats.

## 2014-10-21 NOTE — Plan of Care (Signed)
Problem: Alteration in thought process Goal: LTG-Patient has not harmed self or others in at least 2 days Outcome: Progressing Pt safe on the unit at this time

## 2014-10-22 MED ORDER — OLANZAPINE 2.5 MG PO TABS
2.5000 mg | ORAL_TABLET | Freq: Every day | ORAL | Status: DC
  Administered 2014-10-23: 2.5 mg via ORAL
  Filled 2014-10-22 (×3): qty 1

## 2014-10-22 MED ORDER — OLANZAPINE 5 MG PO TBDP
ORAL_TABLET | ORAL | Status: AC
  Administered 2014-10-22: 5 mg via ORAL
  Filled 2014-10-22: qty 1

## 2014-10-22 MED ORDER — OLANZAPINE 5 MG PO TBDP
5.0000 mg | ORAL_TABLET | Freq: Once | ORAL | Status: AC
  Administered 2014-10-22: 5 mg via ORAL
  Filled 2014-10-22: qty 1

## 2014-10-22 MED ORDER — OLANZAPINE 7.5 MG PO TABS
7.5000 mg | ORAL_TABLET | Freq: Every day | ORAL | Status: DC
  Administered 2014-10-22 – 2014-10-25 (×4): 7.5 mg via ORAL
  Filled 2014-10-22 (×3): qty 1
  Filled 2014-10-22 (×2): qty 3
  Filled 2014-10-22: qty 1
  Filled 2014-10-22 (×2): qty 3
  Filled 2014-10-22: qty 1

## 2014-10-22 MED ORDER — HYDROXYZINE HCL 25 MG PO TABS
25.0000 mg | ORAL_TABLET | ORAL | Status: DC | PRN
  Administered 2014-10-22 – 2014-10-26 (×3): 25 mg via ORAL
  Filled 2014-10-22: qty 1
  Filled 2014-10-22: qty 6
  Filled 2014-10-22 (×2): qty 1

## 2014-10-22 MED ORDER — SERTRALINE HCL 25 MG PO TABS
125.0000 mg | ORAL_TABLET | Freq: Every day | ORAL | Status: DC
  Administered 2014-10-23 – 2014-10-26 (×4): 125 mg via ORAL
  Filled 2014-10-22 (×4): qty 1
  Filled 2014-10-22 (×2): qty 3
  Filled 2014-10-22: qty 1
  Filled 2014-10-22 (×2): qty 3

## 2014-10-22 NOTE — Plan of Care (Signed)
Problem: Aggression Towards others,Towards Self, and or Destruction Goal: STG-Patient will comply with prescribed medication regimen (Patient will comply with prescribed medication regimen) Outcome: Progressing Pt adheres to medication regimen

## 2014-10-22 NOTE — Progress Notes (Signed)
Patient ID: Mary Cooley, female   DOB: [birth year], 29 y.o.   MRN: [id number]  1:1 Note:   Pt currently sitting in bed. Pt crying, stating 'I knew this was the day that I was going to die. I don't feel good today. I'm sorry for all of the panic I caused. I think it's the change in medications today or something."  Sitter currently at bedside, conversing with the pt. Pt emotionally supported and offered food/fluids. Pt refuses.  Pt safety maintained with a 1:1 per MD orders. Will continue to monitor.

## 2014-10-22 NOTE — Progress Notes (Signed)
Patient ID: Mary Cooley, female   DOB: [birth year], 29 y.o.   MRN: [id number]   Pt currently presents with an appropriate affect and restless, labile behavior. Pt has crying spells throughout the day. Pt preoccupied with discharging to see "Rueben and my cats." Pt had a period of confusion and aggression this afternoon, see chart.    Pt provided with medications per providers orders. Pt's labs and vitals were monitored throughout the day. Pt supported emotionally and encouraged to express concerns and questions. Pt educated on medications, healthy dietary habits and anxiety management. Pt placed on a 1:1 per Md orders.   Pt's safety ensured with 15 minute and environmental checks. Pt currently denies SI/HI and A/V hallucinations. Pt verbally agrees to seek staff if SI/HI or A/VH occurs and to consult with staff before acting on these thoughts. Pt says numerous negative things about herself, for example referring to herself as a "400 pound woman" and she is currently underweight. Pt has tearful episodes throughout the day saying "I miss my Kitty and Audelia Hives." Will continue POC.

## 2014-10-22 NOTE — Progress Notes (Signed)
Patient ID: Mary Cooley, female   DOB: 17-Apr-1985, 29 y.o.   MRN: [id number]  Pt became agitated, states to writer "There is a storm coming, I can feel it. I think the lightning storm is here, I've had this dream before. This is how I die." Writer asks pt to collaborate on a puzzle with her, pt refuses. Pt begins exhibiting bizarre behaviors including putting head in another pt's lap, trying to sit on another pt, lying on the floor. Pt asked to walk with writer to her room. Pt started to walk down the hallway turned and began grabbing at writers arms and posturing close to Probation officer. Pt asked to keep distance and keep hands to herself. Pt fell to floor, began grabbing another pt's leg who was walking by at the time. Pt encouraged to go lay in bed if tired and to talk with Probation officer. Pt repeating, "this is how I'm going to die, I need a blanket." Writer attempting to redirect pt, offers her blanket. Pt overhears another pt laughing from that pts room. Pt says "see I can hear her laughing now!!" and starts running into the other pts room. Pt placed in PRT hold standing at 1426. Pt sits down with redirection. Pt then moved to room, lying down with staff at bedside at 1428. Eappen, MD notified at 1430.

## 2014-10-22 NOTE — [facility] Group Notes (Signed)
[facility] LCSW Group Therapy  10/22/2014   11:15 AM  Type of Therapy:  Group Therapy  Participation Level:  Did Not Attend despite encouragement of MHT and CSW  Lyla Glassing 10/22/2014
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
## 2014-10-22 NOTE — Progress Notes (Signed)
Patient ID: Mary Cooley, female   DOB: 1985-03-13, 29 y.o.   MRN: [id number] Adult Psychoeducational Group Note  Date:  10/22/2014 Time:  09:15am  Group Topic/Focus:  Healthy Communication:   The focus of this group is to discuss communication, barriers to communication, as well as healthy ways to communicate with others.  Participation Level:  Active  Participation Quality:  Attentive, Redirectable and Sharing   Affect:  Appropriate  Cognitive:  Alert and Oriented  Insight: Limited  Engagement in Group: Engaged   Modes of Intervention:  Activity, Discussion, Education, Role-play and Support  Additional Comments:  Pt able to identify one daily goal, one healthy communication technique he currently uses and one unhealthy communication technique to work on in the future.   Elenore Rota 10/22/2014, 11:10 AM

## 2014-10-22 NOTE — Progress Notes (Signed)
Patient ID: Mary Cooley, female   DOB: 1986-02-21, 29 y.o.   MRN: [id number]  1:1 Note:  Pt sitting on bed, coloring picture with colored pencils. Pt states "I think I just needed to punish myself, I am so used to having to be punished by my mom that if no one punishes me when I do something, I want to." Sitter at bedside right now. Offered a break.  Pt remains safe with a 1:1. Pt currently in no distress.

## 2014-10-22 NOTE — Progress Notes (Signed)
1:1 Nursing Note: patient seen at this time sleeping. Respiration even and unlabored. No distress noted. Sitter @ arms length. Patient remains on 1:1. Will continue to monitor patient.

## 2014-10-22 NOTE — Progress Notes (Signed)
Psychoeducational Group Note  Date:  10/22/2014 Time:  2053  Group Topic/Focus:  Wrap-Up Group:   The focus of this group is to help patients review their daily goal of treatment and discuss progress on daily workbooks.  Participation Level: Did Not Attend  Participation Quality:  Not Applicable  Affect:  Not Applicable  Cognitive:  Not Applicable  Insight:  Not Applicable  Engagement in Group: Not Applicable  Additional Comments:  The patient did not attend group this evening since she was asleep in her room.   Archie Balboa S 10/22/2014, 8:54 PM

## 2014-10-23 LAB — VITAMIN D 1,25 DIHYDROXY
VITAMIN D 1, 25 (OH) TOTAL: 39 pg/mL
Vitamin D2 1, 25 (OH)2: <10 pg/mL
Vitamin D3 1, 25 (OH)2: 39 pg/mL

## 2014-10-23 MED ORDER — OLANZAPINE 5 MG PO TABS
5.0000 mg | ORAL_TABLET | Freq: Every day | ORAL | Status: DC
  Administered 2014-10-24 – 2014-10-26 (×3): 5 mg via ORAL
  Filled 2014-10-23: qty 2
  Filled 2014-10-23: qty 3
  Filled 2014-10-23 (×3): qty 1
  Filled 2014-10-23: qty 3
  Filled 2014-10-23: qty 1
  Filled 2014-10-23: qty 3
  Filled 2014-10-23: qty 2
  Filled 2014-10-23: qty 3
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
## 2014-10-23 NOTE — Progress Notes (Signed)
1:1 Nursing Note: patient seen sleeping at this time. Remains on 1:1. Will continue to monitor patient for safety and stability.

## 2014-10-23 NOTE — [facility] Group Notes (Signed)
[facility] Group Notes: (Clinical Social Work)   10/23/2014      Type of Therapy:  Group Therapy   Participation Level:  Did Not Attend despite MHT prompting   Selmer Dominion, LCSW 10/23/2014, 5:47 PM

## 2014-10-23 NOTE — Progress Notes (Signed)
Patient ID: Mary Cooley, female   DOB: 02-25-1986, 29 y.o.   MRN: [id number]  1:1 Nursing Note-  Patient is in her room at this time, eating cereal. Patient stated that she does not feel worthy to eat food and feels, "five hundred pounds." She states, "I know that's my mental illness." She continues to stay hypervigilant about food and beverages she ingests. Writer spoke to her about the importance of taking medications as prescribed and the importance of nutrition. Patient denies SI/HI and A/V hallucinations. Patient denies any pain at this time. Q15 minute safety checks. 1:1 is continued for safety.

## 2014-10-23 NOTE — [facility] Group Notes (Signed)
[facility] Group Notes:  (Nursing/MHT/Case Management/Adjunct)  Date:  10/23/2014  Time:  11:57 AM  Type of Therapy:  Psychoeducational Skills  Participation Level:  Did Not Attend  Participation Quality:  Did Not Attend  Affect:  Did Not Attend  Cognitive:  Did Not Attend  Insight:  None  Engagement in Group:  Did Not Attend  Modes of Intervention:  Did Not Attend  Summary of Progress/Problems: Pt did not attend patient self inventory group.   Benancio Deeds Shanta 10/23/2014, 11:57 AM

## 2014-10-23 NOTE — Progress Notes (Signed)
[facility] 1:1 Observation Documentation  For the first (8) hours following discontinuation of 1:1 precautions, a progress note entry by nursing staff should be documented at least every 2 hours, reflecting the patient's behavior, condition, mood, and conversation.  Use the progress notes for additional entries.  Time 1:1 discontinued:  1000  Patient's Behavior:  Patient is in room at this time, calm.  Patient's Condition:  No distress noted, respirations are even and unlabored.  Patient's Conversation:  "Can you pick up my coffee." Speaking to the MHT at this time.   Gaylan Gerold E 10/23/2014, 10:29 AM

## 2014-10-23 NOTE — Progress Notes (Signed)
[facility] 1:1 Observation Documentation  For the first (8) hours following discontinuation of 1:1 precautions, a progress note entry by nursing staff should be documented at least every 2 hours, reflecting the patient's behavior, condition, mood, and conversation.  Use the progress notes for additional entries.  Time 1:1 discontinued:  1000  Patient's Behavior:  Patient is calm at this time sitting and eating her food for dinner.   Patient's Condition:  No distress noted, patient's respirations are even and unlabored.   Patient's Conversation:  No conversation at this time because patient is eating.   Gaylan Gerold E 10/23/2014, 6:04 PM

## 2014-10-23 NOTE — Progress Notes (Signed)
[facility] 1:1 Observation Documentation  For the first (8) hours following discontinuation of 1:1 precautions, a progress note entry by nursing staff should be documented at least every 2 hours, reflecting the patient's behavior, condition, mood, and conversation.  Use the progress notes for additional entries.  Time 1:1 discontinued:  1000  Patient's Behavior:  Patient is calm at this time. Smiling and talking with a peer.  Patient's Condition:  No distress noted, respirations are even and unlabored.   Patient's Conversation:  Speaking to a peer at this time about religion.   Gaylan Gerold E 10/23/2014, 4:16 PM

## 2014-10-23 NOTE — Progress Notes (Signed)
[facility] 1:1 Observation Documentation  For the first (8) hours following discontinuation of 1:1 precautions, a progress note entry by nursing staff should be documented at least every 2 hours, reflecting the patient's behavior, condition, mood, and conversation.  Use the progress notes for additional entries.  Time 1:1 discontinued:  1000  Patient's Behavior:  Patient is seen in her room picking up her folder at this time. Writer gave patient a pair of jeans and patient proceeded to put them on.  Patient's Condition:  No distress noted, patient is calm. Respirations are even and unlabored.   Patient's Conversation:  "Thank you, I am going to put them on over my scrubs."   Ryden Wainer E 10/23/2014, 12:09 PM

## 2014-10-23 NOTE — Progress Notes (Signed)
Did not attend group 

## 2014-10-23 NOTE — Progress Notes (Signed)
[facility] 1:1 Observation Documentation  For the first (8) hours following discontinuation of 1:1 precautions, a progress note entry by nursing staff should be documented at least every 2 hours, reflecting the patient's behavior, condition, mood, and conversation.  Use the progress notes for additional entries.  Time 1:1 discontinued:  1000  Patient's Behavior:  Patient in her room at this time arranging her colored pencils.   Patient's Condition:  No distress noted at this time. Respirations are even and unlabored.  Patient's Conversation:  "I am okay just needing to get all these colored pencils in order." "It's very meticulous."  [name] 10/23/2014, 3:00 PM

## 2014-10-23 NOTE — Progress Notes (Signed)
1:1 nursing Note: patient awake at this time. Requesting to be discharged stated "I feel like a prison here. None of my request is being granted. I prefer to go to jail than sit here and be monitoring like a kid, telling me what to eat and when to eat, people following me round. Call the ambulance and take me out here. Writer spoke with the patient who later calmed down. Lying in her bed at this time. ] Support and encouragement offered. Patient encouraged to verbalize needs to staff. Patient remains on 1:1 for safety.

## 2014-10-23 NOTE — Plan of Care (Signed)
Problem: Alteration in thought process Goal: STG-Patient is able to discuss thoughts with staff Outcome: Progressing Patient is getting better at coming to staff and discussing her thoughts.

## 2014-10-23 NOTE — Progress Notes (Signed)
D: Patient doing much better. Socializing and interacting appropriately. Denies pain, SI, AH/VH at this time. Accepted all her medications.  A: Support and encouragement offered to patient. Scheduled medications given as ordered. Every 15 minutes check for safety maintained. Will continue to monitor patient for safety and stability. R: Patient remains safe.

## 2014-10-24 NOTE — Progress Notes (Signed)
Patient ID: Mary Cooley, female   DOB: [birth year], 29 y.o.   MRN: [id number]  DAR: Pt. Denies SI/HI and A/V Hallucinations. Patient reported a headache this morning and received PRN Tylenol which provided relief. She reports she slept poorly last night, appetite is good, energy level is normal, and concentration level is good. She rates her depression 2/10, anxiety 3/10, and her hopelessness 1/10. Support and encouragement provided to the patient. Scheduled medications administered to patient per physician's orders. Patient remains somewhat tangential and hyper-focused on her colored pencils. She was seen earlier in the hallway visibly upset. Writer spent some time 1:1 with her and patient was able to calm down some. She continues to make self negating comments about herself such as,"I'm 500 pounds.I don't deserve to eat!" At times she is able to recognize this is not factual she states, "this mental illness is kicking my butt today." Encouragement and emotional support provided. Patient is seen in the milieu from time to time when she is not in her room and is interacting with some of her peers. Q15 minute checks are maintained for safety.

## 2014-10-24 NOTE — [facility] Group Notes (Signed)
[facility] LCSW Group Therapy  10/24/2014 1:15 pm  Type of Therapy: Process Group Therapy  Participation Level: Did not attend  Summary of Progress/Problems: Today's group addressed the issue of overcoming obstacles.  Patients were asked to identify their biggest obstacle post d/c that stands in the way of their on-going success, and then problem solve as to how to manage this.  Trish Mage 10/24/2014   3:38 PM
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
## 2014-10-24 NOTE — Plan of Care (Signed)
Problem: Alteration in thought process Goal: LTG-Patient is able to perceive the environment accurately Outcome: Progressing Patient does have some moments of clarity but others she is not about to perceive it accurately. There has been some improvement though overall.

## 2014-10-25 LAB — PREGNANCY, URINE: PREG TEST UR: NEGATIVE

## 2014-10-25 NOTE — Plan of Care (Signed)
Problem: Alteration in thought process Goal: LTG-Patient verbalizes understanding importance med regimen (Patient verbalizes understanding of importance of medication regimen and need to continue outpatient care.)  Outcome: Progressing She states that she knows her medications are important but she continues to fear the side effects.  Encouraged her to continue to talk with the medical staff of her concerns.
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
## 2014-10-25 NOTE — Progress Notes (Signed)
  [facility] Adult Case Management Discharge Plan :  Will you be returning to the same living situation after discharge:  Yes,  home At discharge, do you have transportation home?: Yes,  TCT Do you have the ability to pay for your medications: Yes,  MCD  Release of information consent forms completed and in the chart;  Patient's signature needed at discharge.  Patient to Follow up at: Follow-up Information    Follow up with [facility].   Specialty:  Professional Counselor   Why:  You can go for a walk-in apointment between 9 and 12 and 1 and 3, or call them to find out when to come in for an appointment again. Make sure to thell them you need help with medication management. Transitional Care team will give you a ride [phone].   Contact information:   [facility] [place] [id number] [phone]       Follow up with [facility] On 10/27/2014.   Why:  Primary care appointment with Dr. Iona Beard on Thursday August 25th at 2pm. Please call office if you need to reschedule.   Contact information:   [place] [phone]      Patient denies SI/HI: Yes,  yes    Land and Suicide Prevention discussed: Yes,  yes  Have you used any form of tobacco in the last 30 days? (Cigarettes, Smokeless Tobacco, Cigars, and/or Pipes): No  Has patient been referred to the Quitline?: N/A patient is not a smoker  [place] B 10/25/2014, 4:45 PM
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
## 2014-10-25 NOTE — Progress Notes (Signed)
D: Patient alert and oriented x 3. Patient denies SI/HI/AVH. Patient complained of pain in right foot 3/10. Tylenol was given at 0039, reassessment patient was in bed resting with eyes closed. Patient came to nurses station multiple times during shift. Patient loves to color cat pictures. This Probation officer printed patient 4 pictures of cats for patient to color. During HS medication administration patient became upset with Probation officer for offering regular ginger ale. Patient then requested diet drink. This nurse advised patient she has a ensure scheduled. Patient requested boost. I boost was subbed for ensure. Patient completed boost %100.  A: Staff to monitor Q 15 mins for safety. Encouragement and support offered. Scheduled medications administered per orders. R: Patient remains safe on the unit. Patient attended group tonight. Patient visible on hte unit and interacting with peers. Patient taking administered medications.

## 2014-10-25 NOTE — [facility] Group Notes (Signed)
[place] LCSW Group Therapy  10/25/2014 , 12:47 PM   Type of Therapy: Invited.  Chose to not attend  Summary of Progress/Problems: Today's group focused on the term Diagnosis.  Participants were asked to define the term, and then pronounce whether it is a negative, positive or neutral term.    Trish Mage 10/25/2014 , 12:47 PM
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
## 2014-10-25 NOTE — [facility] Group Notes (Signed)
[facility] Group Notes:  (Nursing/MHT/Case Management/Adjunct)  Date:  10/25/2014  Time:  12:20 PM  Type of Therapy:  Group Therapy  Participation Level:  Active  Participation Quality:  Appropriate and Attentive  Affect:  Flat  Cognitive:  Alert  Insight:  Appropriate  Engagement in Group:  Developing/Improving  Modes of Intervention:  Discussion and Education  Summary of Progress/Problems:  Purpose of the group was recovery and goals. Discussed the importance of setting appropriate and measurable goals. Reviewed sleep hygiene. Mary Cooley was present in group. She continued to work on her coloring but was able to follow along and participate with the group activity.  Barbette Or Jailene Cupit 10/25/2014, 12:20 PM

## 2014-10-26 ENCOUNTER — Encounter (HOSPITAL_COMMUNITY): Payer: Self-pay | Admitting: Registered Nurse

## 2014-10-26 DIAGNOSIS — F22 Delusional disorders: Secondary | ICD-10-CM | POA: Diagnosis present

## 2014-10-26 MED ORDER — NICOTINE 14 MG/24HR TD PT24
14.0000 mg | MEDICATED_PATCH | Freq: Every day | TRANSDERMAL | Status: DC
  Administered 2014-10-26: 14 mg via TRANSDERMAL
  Filled 2014-10-26 (×2): qty 1
  Filled 2014-10-26: qty 3
  Filled 2014-10-26: qty 1
  Filled 2014-10-26 (×3): qty 3

## 2014-10-26 MED ORDER — OLANZAPINE 5 MG PO TABS: 5.0000 mg | ORAL_TABLET | Freq: Every day | ORAL | Status: AC

## 2014-10-26 MED ORDER — SERTRALINE HCL 25 MG PO TABS: 125.0000 mg | ORAL_TABLET | Freq: Every day | ORAL | Status: AC

## 2014-10-26 MED ORDER — OLANZAPINE 7.5 MG PO TABS: 7.5000 mg | ORAL_TABLET | Freq: Every day | ORAL | Status: AC

## 2014-10-26 MED ORDER — CYANOCOBALAMIN 500 MCG PO TABS: 500.0000 ug | ORAL_TABLET | Freq: Every day | ORAL | Status: AC

## 2014-10-26 MED ORDER — HYDROXYZINE HCL 25 MG PO TABS: 25.0000 mg | ORAL_TABLET | ORAL | Status: AC | PRN

## 2014-10-26 MED ORDER — NICOTINE 14 MG/24HR TD PT24: 14.0000 mg | MEDICATED_PATCH | Freq: Every day | TRANSDERMAL | Status: AC

## 2014-10-26 MED ORDER — MIRTAZAPINE 7.5 MG PO TABS: 7.5000 mg | ORAL_TABLET | Freq: Every day | ORAL | Status: AC

## 2014-10-26 MED ORDER — BENZTROPINE MESYLATE 0.5 MG PO TABS: 0.5000 mg | ORAL_TABLET | Freq: Every day | ORAL | Status: AC

## 2014-10-26 NOTE — Progress Notes (Signed)
D: Patient alert and oriented x 4. Patient during assessment denied pain, SI/HI/AVH. At 2109 patient requested tylenol for a headache 4 out of 10 and for right foot pain 2/10. PRN giver per order with effective results. Patient at first of shift was worried over pregnancy test. Patient stated, "I can't take care a baby, I can't hardly take care of myself and my cats. If I am pregnant I will have to have an abortion or give it up for adoption." This writer reassured patient not to make any decisions until we get the results back first.  Patient agreed.  When test results posted this writer told patient results, patient voiced she was glad.   A: Staff to monitor Q 15 mins for safety. Encouragement and support offered. Scheduled medications administered per orders. R: Patient remains safe on the unit. Patient attended group tonight. Patient visible on hte unit and interacting with peers. Patient taking administered medications.
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
## 2014-10-26 NOTE — [facility] Suicide Risk Assessment (Addendum)
[facility] Discharge Suicide Risk Assessment   Demographic Factors:  Caucasian  Total Time spent with patient: 30 minutes  Musculoskeletal: Strength & Muscle Tone: within normal limits Gait & Station: normal Patient leans: N/A  Psychiatric Specialty Exam: Physical Exam  Review of Systems  Psychiatric/Behavioral: Negative for depression, suicidal ideas, hallucinations and substance abuse. The patient is not nervous/anxious.   All other systems reviewed and are negative.   Blood pressure 99/68, pulse 89, temperature 97.5 F (36.4 C), temperature source Oral, resp. rate 16, height 5\' 9"  (1.753 m), weight 45.36 kg (100 lb), SpO2 100 %.Body mass index is 14.76 kg/(m^2).  General Appearance: Casual  Eye Contact::  Fair  Speech:  Clear and [id number]  Volume:  Normal  Mood:  Euthymic  Affect:  Appropriate  Thought Process:  Coherent  Orientation:  Full (Time, Place, and Person)  Thought Content:  Delusions has chronic delusions about her neighbor , but is able to cope with it better.  Suicidal Thoughts:  No  Homicidal Thoughts:  No  Memory:  Immediate;   Fair Recent;   Fair Remote;   Fair  Judgement:  Fair  Insight:  Fair  Psychomotor Activity:  Normal  Concentration:  Fair  Recall:  [employer] [place]  Language: Fair  Akathisia:  No  Handed:  Right  AIMS (if indicated):     Assets:  Communication Skills Desire for Improvement Social Support  Sleep:  Number of Hours: 6  Cognition: WNL  ADL's:  Intact   Have you used any form of tobacco in the last 30 days? (Cigarettes, Smokeless Tobacco, Cigars, and/or Pipes): No  Has this patient used any form of tobacco in the last 30 days? (Cigarettes, Smokeless Tobacco, Cigars, and/or Pipes) Yes, Prescription provided for nicotine patch  Mental Status Per Nursing Assessment::   On Admission:  NA  Current Mental Status by Physician: pt denies SI/HI/AH/VH  Loss Factors: NA  Historical Factors: Impulsivity  Risk  Reduction Factors:   Living with another person, especially a relative and Positive social support  Continued Clinical Symptoms:  Previous Psychiatric Diagnoses and Treatments Medical Diagnoses and Treatments/Surgeries  Cognitive Features That Contribute To Risk:  Polarized thinking    Suicide Risk:  Minimal: No identifiable suicidal ideation.  Patients presenting with no risk factors but with morbid ruminations; may be classified as minimal risk based on the severity of the depressive symptoms  Principal Problem: Delusional disorder Discharge Diagnoses:  Patient Active Problem List   Diagnosis Date Noted  . Severe malnutrition [E41] 10/18/2014  . Hyperprolactinemia [E22.1] 10/14/2014  . GAD (generalized anxiety disorder) [F41.1] 10/13/2014  . Panic disorder [F41.0] 10/13/2014  . Delusional disorder, persecutory type [F22] 10/13/2014  . Benzodiazepine dependence [F13.20] 10/13/2014  . Asperger's disorder [F84.5] 10/13/2014  . Diarrhea [R19.7] 05/05/2014  . Mass of left lower leg [R22.42] 05/05/2014  . Anemia in chronic illness [D63.8] 04/08/2014  . 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 Splenic mass s/p lap splenectomy 03/31/2014 [R16.1] 03/31/2014  . Coagulopathy [D68.9] 10/04/2013  . Other pancytopenia [D61.818] 09/29/2013  . Bruising [T14.8] 09/29/2013  . Weight loss [R63.4] 09/29/2013    Follow-up Information    Follow up with [facility].   Specialty:  Professional Counselor   Why:  You can go for a walk-in apointment between 9 and 12 and 1 and 3, or call them to find out when to come in for an appointment again. Make sure  to thell them you need help with medication management. Transitional Care team will give you a ride [phone].   Contact information:   [facility] [place]  [phone]       Follow up with [facility] On 10/27/2014.   Why:  Primary care appointment with Dr. Iona Beard on Thursday August 25th at 2pm. Please call office if you need to reschedule.   Contact information:   [facility]/Follow-up recommendations:  Activity:  No restrictions Diet:  regular Tests:  as needed Other:  follow up with after care  Is patient on multiple antipsychotic therapies at discharge:  No   Has Patient had three or more failed trials of antipsychotic monotherapy by history:  No  Recommended Plan for Multiple Antipsychotic Therapies: NA    Wahneta Derocher md 10/26/2014, 9:17 AM

## 2014-10-26 NOTE — Progress Notes (Signed)
Discharge Note:  Patient discharged with driver.  Denied SI and HI.  Denied A/V hallucinations.  Denied pain.  Suicide prevention information given and discussed with patient who stated she understood and had no questions.  Patient stated she received all her belongings, clothing, toiletries, misc items, prescriptions, medications, etc.  Patient stated she appreciated all assistance received from [facility] staff.
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
## 2014-10-26 NOTE — Progress Notes (Signed)
D:  Patient's self inventory sheet, patient sleeps good, no sleep medication given.  Good appetite, low energy level, good concentration.  Rated depression and hopeless #1, anxiety #3.  Denied withdrawals, sometimes has cravings.  Denied SI.  Denied physical problems.  Goal is to go home to [place], understand discharge plan, finish coloring.  Plans to talked to MD and staff.  "I am happy.  I apologize to [facility] and gave her flower mandala and she gave me pretty jeans, accept apology.   Resolve conflict and made peace."  "[name] not meant 2 cut off phone last night.  He will come today." A:  Medications administered per MD orders.  Emotional support and encouragement given patient. R:  Denied SI and HI, contracts for safety.  Denied A/V hallucinations.  Safety maintained with 15 minute checks.

## 2014-12-08 ENCOUNTER — Ambulatory Visit (HOSPITAL_BASED_OUTPATIENT_CLINIC_OR_DEPARTMENT_OTHER): Payer: Medicaid Other | Admitting: Hematology and Oncology

## 2014-12-08 ENCOUNTER — Encounter: Payer: Self-pay | Admitting: Hematology and Oncology

## 2014-12-08 ENCOUNTER — Other Ambulatory Visit (HOSPITAL_BASED_OUTPATIENT_CLINIC_OR_DEPARTMENT_OTHER): Payer: Medicaid Other

## 2014-12-08 VITALS — BP 113/67 | HR 72 | Temp 98.9°F | Resp 17 | Ht 69.0 in | Wt 107.8 lb

## 2014-12-08 DIAGNOSIS — D638 Anemia in other chronic diseases classified elsewhere: Secondary | ICD-10-CM | POA: Diagnosis not present

## 2014-12-08 DIAGNOSIS — D72819 Decreased white blood cell count, unspecified: Secondary | ICD-10-CM

## 2014-12-08 DIAGNOSIS — E43 Unspecified severe protein-calorie malnutrition: Secondary | ICD-10-CM

## 2014-12-08 DIAGNOSIS — Z299 Encounter for prophylactic measures, unspecified: Secondary | ICD-10-CM

## 2014-12-08 DIAGNOSIS — T148XXA Other injury of unspecified body region, initial encounter: Secondary | ICD-10-CM

## 2014-12-08 DIAGNOSIS — Z23 Encounter for immunization: Secondary | ICD-10-CM | POA: Diagnosis not present

## 2014-12-08 LAB — CBC & DIFF AND RETIC
BASO%: 0.9 % (ref 0.0–2.0)
Basophils Absolute: 0 10*3/uL (ref 0.0–0.1)
EOS ABS: 0 10*3/uL (ref 0.0–0.5)
EOS%: 0.4 % (ref 0.0–7.0)
HCT: 29.1 % — ABNORMAL LOW (ref 34.8–46.6)
HEMOGLOBIN: 10.2 g/dL — AB (ref 11.6–15.9)
IMMATURE RETIC FRACT: 1.4 % — AB (ref 1.60–10.00)
LYMPH#: 1.6 10*3/uL (ref 0.9–3.3)
LYMPH%: 44.3 % (ref 14.0–49.7)
MCH: 31.7 pg (ref 25.1–34.0)
MCHC: 34.9 g/dL (ref 31.5–36.0)
MCV: 90.8 fL (ref 79.5–101.0)
MONO#: 0.3 10*3/uL (ref 0.1–0.9)
MONO%: 7.9 % (ref 0.0–14.0)
NEUT%: 46.5 % (ref 38.4–76.8)
NEUTROS ABS: 1.6 10*3/uL (ref 1.5–6.5)
Platelets: 388 10*3/uL (ref 145–400)
RBC: 3.2 10*6/uL — AB (ref 3.70–5.45)
RDW: 12.9 % (ref 11.2–14.5)
RETIC %: 0.73 % (ref 0.70–2.10)
Retic Ct Abs: 23.36 10*3/uL — ABNORMAL LOW (ref 33.70–90.70)
WBC: 3.5 10*3/uL — AB (ref 3.9–10.3)

## 2014-12-08 MED ORDER — INFLUENZA VAC SPLIT QUAD 0.5 ML IM SUSY
0.5000 mL | PREFILLED_SYRINGE | Freq: Once | INTRAMUSCULAR | Status: AC
  Administered 2014-12-08: 0.5 mL via INTRAMUSCULAR
  Filled 2014-12-08: qty 0.5

## 2014-12-08 NOTE — Assessment & Plan Note (Signed)
She has signs of malnutrition and appears underweight. She have some underlying psychiatric issue. I will defer to her psychiatrist for medical management.

## 2014-12-08 NOTE — Assessment & Plan Note (Signed)
This could be related to chronic illness. It is stable Continue to monitor only.

## 2014-12-08 NOTE — Assessment & Plan Note (Signed)
We discussed the importance of preventive care and reviewed the vaccination programs. She does not have any prior allergic reactions to influenza vaccination. She agrees to proceed with influenza vaccination today and we will administer it today at the clinic.  

## 2014-12-08 NOTE — Progress Notes (Signed)
[place], MD SUMMARY OF HEMATOLOGIC HISTORY:  She was found to have abnormal CBC from routine blood work monitoring by her primary care provider in May of 2015. At that time, her white blood cell count was 3.4, hemoglobin 10.4, platelet count 78,000 with elevated INR. This patient had prior history of posttraumatic stress disorder, depression, and anorexia nervosa. She had weight loss recently. She complained of intermittent bleeding from her stool. She also has extensive bruising throughout. She denies recent epistaxis, hematuria, or heavy menstruation. In fact, she has not had a period for a long time. The patient denies history of liver disease, exposure to heparin, history of cardiac murmur/prior cardiovascular surgery or recent new medications She denies prior blood or platelet transfusions. On 10/25/2013, CT scan of the abdomen and pelvis revealed a large splenic mass, suspicious for hemangioma. 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  INTERVAL HISTORY: Please see below for problem oriented charting. She returns for further follow-up. The lump on the left calf is resolving. The patient was committed to psychiatric unit recently due to some mental health issues. Since then, she has been released. Her bruising has resolved. She denies recent infection. She was found to have significant malnutrition and vitamin B-12 deficient and she was placed on oral vitamin B-12 supplements. The patient denies any recent signs or symptoms of bleeding such as spontaneous epistaxis, hematuria or hematochezia.  I have reviewed the past medical history, past surgical history, social history and family history with the patient and they are unchanged from previous note.  ALLERGIES:  is allergic to nsaids; ceclor; peanut-containing drug products; and penicillins.  MEDICATIONS:  Current Outpatient Prescriptions   Medication Sig Dispense Refill  . benztropine (COGENTIN) 0.5 MG tablet Take 1 tablet (0.5 mg total) by mouth at bedtime. 30 tablet 0  . cyanocobalamin 500 MCG tablet Take 1 tablet (500 mcg total) by mouth daily. 30 tablet 0  . hydrOXYzine (ATARAX/VISTARIL) 25 MG tablet Take 1 tablet (25 mg total) by mouth every 4 (four) hours as needed for anxiety. 30 tablet 0  . mirtazapine (REMERON) 7.5 MG tablet Take 1 tablet (7.5 mg total) by mouth at bedtime. 30 tablet 0  . nicotine (NICODERM CQ - DOSED IN MG/24 HOURS) 14 mg/24hr patch Place 1 patch (14 mg total) onto the skin daily. 28 patch 0  . sertraline (ZOLOFT) 25 MG tablet Take 5 tablets (125 mg total) by mouth daily. 150 tablet 0  . OLANZapine (ZYPREXA) 5 MG tablet Take 1 tablet (5 mg total) by mouth daily. (Patient not taking: Reported on 12/08/2014) 30 tablet 0  . OLANZapine (ZYPREXA) 7.5 MG tablet Take 1 tablet (7.5 mg total) by mouth at bedtime. (Patient not taking: Reported on 12/08/2014) 30 tablet 0   No current facility-administered medications for this visit.     REVIEW OF SYSTEMS:   Constitutional: Denies fevers, chills or night sweats Eyes: Denies blurriness of vision Ears, nose, mouth, throat, and face: Denies mucositis or sore throat Respiratory: Denies cough, dyspnea or wheezes Cardiovascular: Denies palpitation, chest discomfort or lower extremity swelling Gastrointestinal:  Denies nausea, heartburn or change in bowel habits Skin: Denies abnormal skin rashes Lymphatics: Denies new lymphadenopathy or easy bruising Neurological:Denies numbness, tingling or new weaknesses Behavioral/Psych: Mood is stable, no new changes  All other systems were reviewed with the patient and are negative.  PHYSICAL EXAMINATION: ECOG PERFORMANCE STATUS: 0 - Asymptomatic  Filed Vitals:   12/08/14 1230  BP: 113/67  Pulse: 72  Temp: 98.9 F (37.2 C)  Resp: 17   Filed Weights   12/08/14 1230  Weight: 107 lb 12.8 oz (48.898 kg)     GENERAL:alert, no distress and comfortable. She is thin and malnourished SKIN: skin color, texture, turgor are normal, no rashes or significant lesions EYES: normal, Conjunctiva are pink and non-injected, sclera clear OROPHARYNX: Poor oral hygiene.  ABDOMEN:abdomen soft, non-tender and normal bowel sounds. Well-healed surgical scar. No organ enlargement Musculoskeletal:no cyanosis of digits and no clubbing  NEURO: alert & oriented x 3 with fluent speech, no focal motor/sensory deficits  LABORATORY DATA:  I have reviewed the data as listed Results for orders placed or performed in visit on 12/08/14 (from the past 48 hour(s))  CBC & Diff and Retic     Status: Abnormal   Collection Time: 12/08/14 12:19 PM  Result Value Ref Range   WBC 3.5 (L) 3.9 - 10.3 10e3/uL   NEUT# 1.6 1.5 - 6.5 10e3/uL   HGB 10.2 (L) 11.6 - 15.9 g/dL   HCT 29.1 (L) 34.8 - 46.6 %   Platelets 388 145 - 400 10e3/uL   MCV 90.8 79.5 - 101.0 fL   MCH 31.7 25.1 - 34.0 pg   MCHC 34.9 31.5 - 36.0 g/dL   RBC 3.20 (L) 3.70 - 5.45 10e6/uL   RDW 12.9 11.2 - 14.5 %   lymph# 1.6 0.9 - 3.3 10e3/uL   MONO# 0.3 0.1 - 0.9 10e3/uL   Eosinophils Absolute 0.0 0.0 - 0.5 10e3/uL   Basophils Absolute 0.0 0.0 - 0.1 10e3/uL   NEUT% 46.5 38.4 - 76.8 %   LYMPH% 44.3 14.0 - 49.7 %   MONO% 7.9 0.0 - 14.0 %   EOS% 0.4 0.0 - 7.0 %   BASO% 0.9 0.0 - 2.0 %   Retic % 0.73 0.70 - 2.10 %   Retic Ct Abs 23.36 (L) 33.70 - 90.70 10e3/uL   Immature Retic Fract 1.40 (L) 1.60 - 10.00 %    Lab Results  Component Value Date   WBC 3.5* 12/08/2014   HGB 10.2* 12/08/2014   HCT 29.1* 12/08/2014   MCV 90.8 12/08/2014   PLT 388 12/08/2014   ASSESSMENT & PLAN:  Anemia in chronic illness This could be related to chronic illness. It is stable Continue to monitor only.    Leukopenia She has intermittent leukopenia. I suspect this is due to malnutrition. Her recent vitamin B-12 level was borderline low and she is currently taking multivitamin  and B-12 supplements. She was also placed on multiple different anti-psychiatric medications which I suspect could cause leukopenia. She is not symptomatic. Recommend observation only.  Severe malnutrition She has signs of malnutrition and appears underweight. She have some underlying psychiatric issue. I will defer to her psychiatrist for medical management.  Preventive measure We discussed the importance of preventive care and reviewed the vaccination programs. She does not have any prior allergic reactions to influenza vaccination. She agrees to proceed with influenza vaccination today and we will administer it today at the clinic.    All questions were answered. The patient knows to call the clinic with any problems, questions or concerns. No barriers to learning was detected.  I spent 15 minutes counseling the patient face to face. The total time spent in the appointment was 20 minutes and more than 50% was on counseling.     Indea Dearman, MD 10/6/20162:12 PM

## 2014-12-08 NOTE — Assessment & Plan Note (Signed)
She has intermittent leukopenia. I suspect this is due to malnutrition. Her recent vitamin B-12 level was borderline low and she is currently taking multivitamin and B-12 supplements. She was also placed on multiple different anti-psychiatric medications which I suspect could cause leukopenia. She is not symptomatic. Recommend observation only.

## 2014-12-09 LAB — FIBRINOGEN: Fibrinogen: 180 mg/dL — ABNORMAL LOW (ref 204–475)

## 2015-05-05 IMAGING — CT CT ABD-PELV W/ CM
2 of 4 series · 17 of 46 positions shown, 19 images · IV contrast (OMNIPAQUE)
Comparison: None.

CLINICAL DATA: Pancytopenia.  Clotting disorder.

EXAM:
CT ABDOMEN AND PELVIS WITH CONTRAST
TECHNIQUE: Multidetector CT imaging of the abdomen and pelvis was performed
using the standard protocol following bolus administration of
intravenous contrast.
CONTRAST:  100mL OMNIPAQUE IOHEXOL 300 MG/ML  SOLN

[Series 2: rtn a/p with · axial · 0.71mm/px · z∈[+1355,+1845]mm · 14 of 108 slices shown, 16 images]
[im 5/108  soft-tissue]
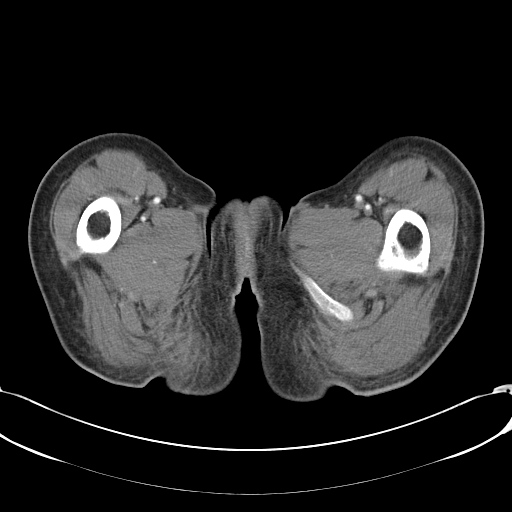
[im 5/108  bone]
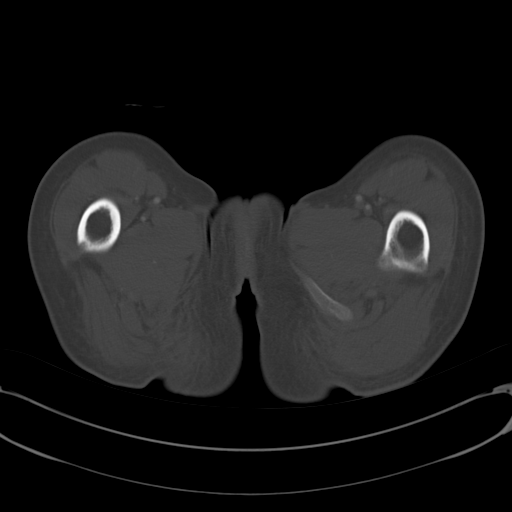
[im 13/108  soft-tissue]
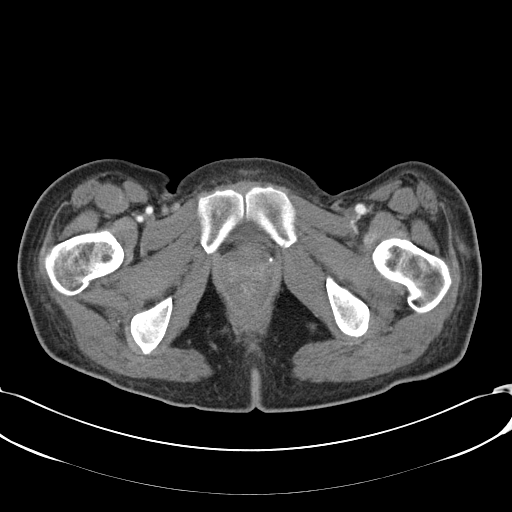
[im 22/108  soft-tissue]
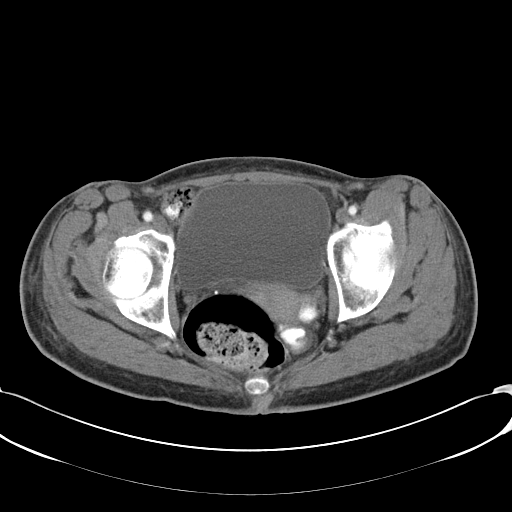
[im 30/108  soft-tissue]
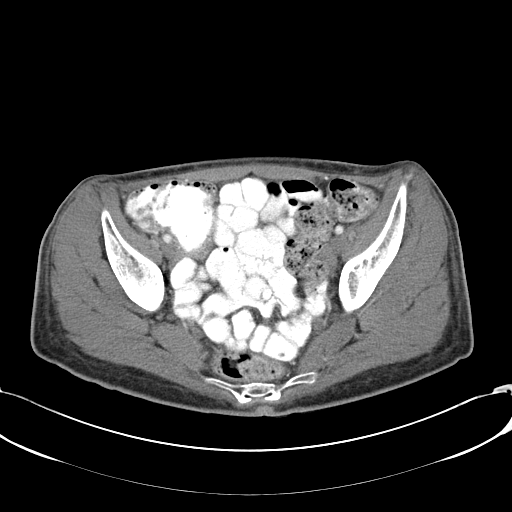
[im 35/108  soft-tissue]
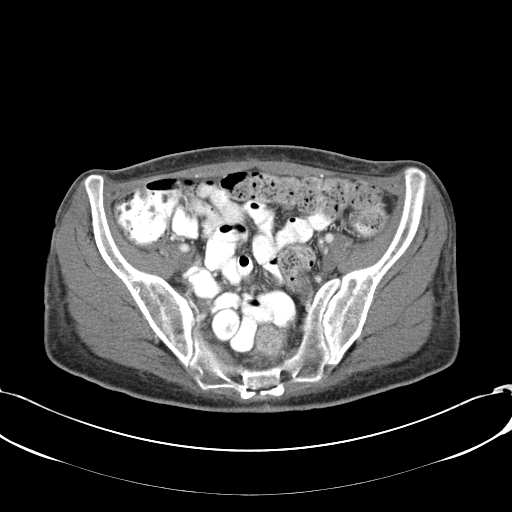
[im 43/108  soft-tissue]
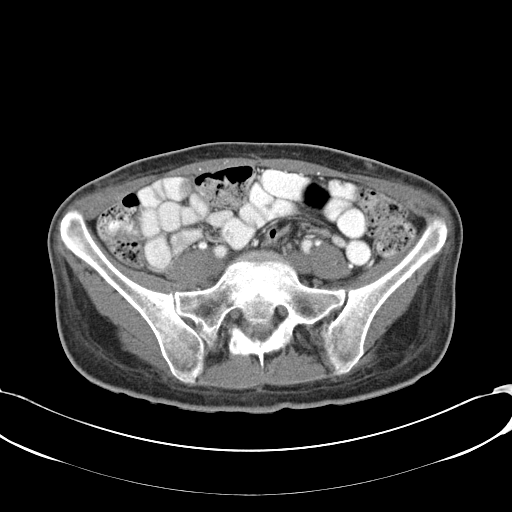
[im 52/108  soft-tissue]
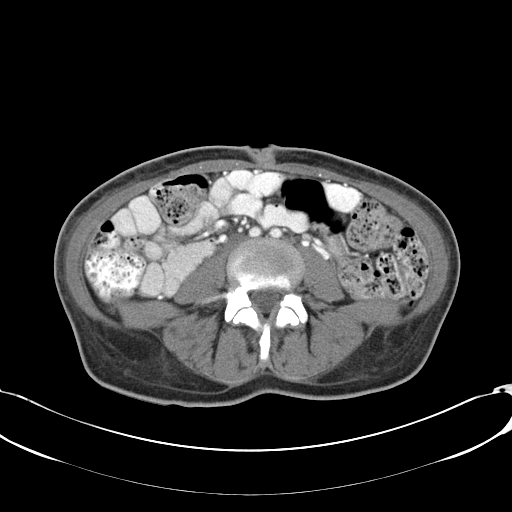
[im 56/108  soft-tissue]
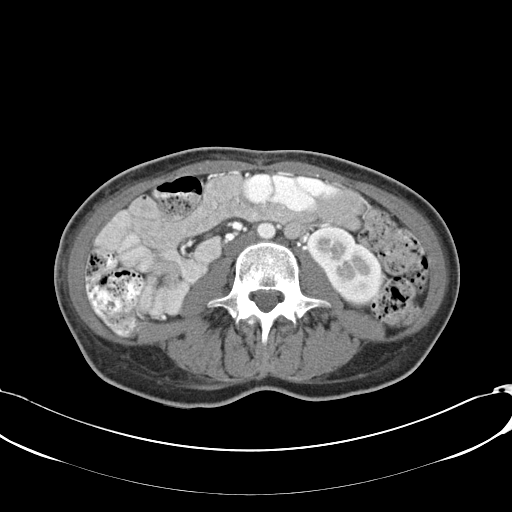
[im 65/108  soft-tissue]
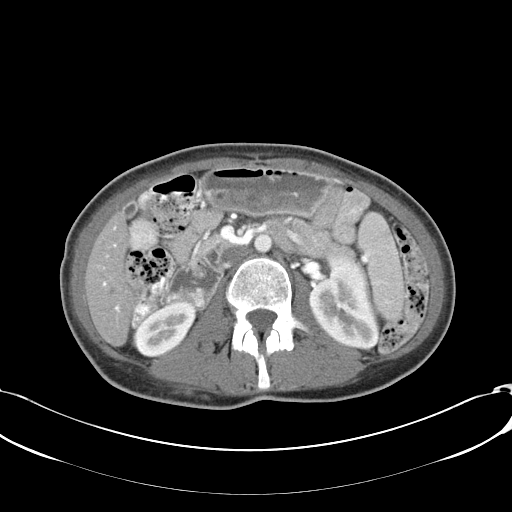
[im 65/108  bone]
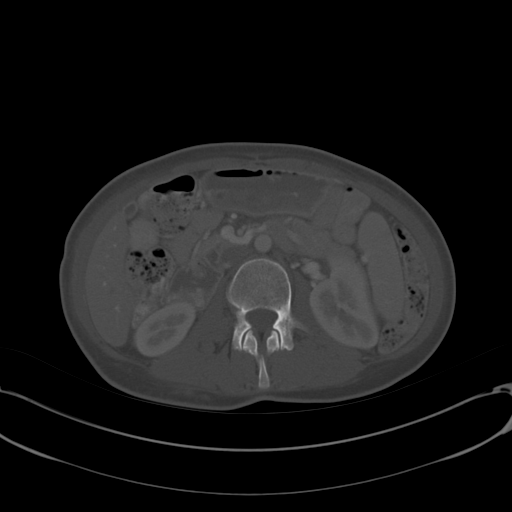
[im 73/108  soft-tissue]
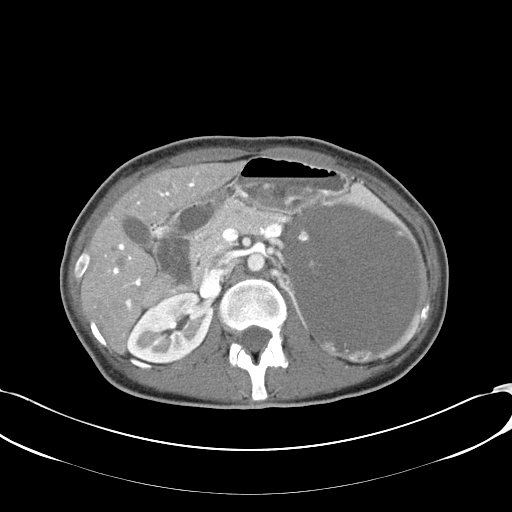
[im 82/108  soft-tissue]
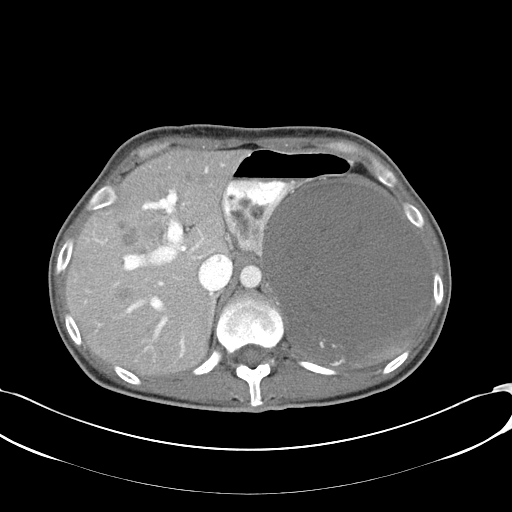
[im 86/108  soft-tissue]
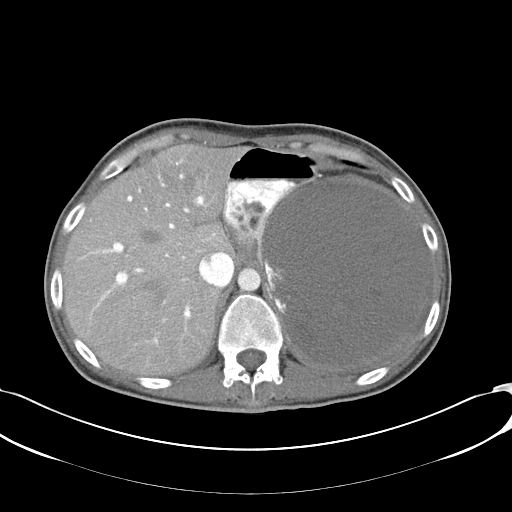
[im 95/108  soft-tissue]
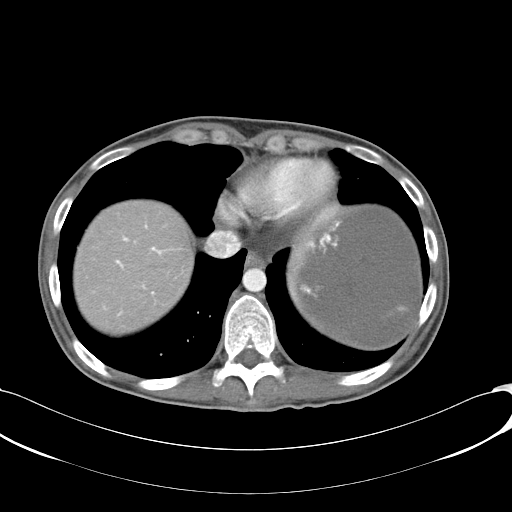
[im 103/108  soft-tissue]
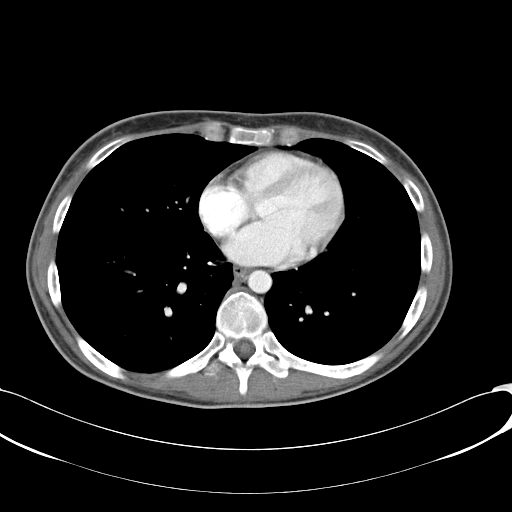

[Series 602: coronal images · coronal · 1.05mm/px · 3 of 70 slices shown]
[im 24/70  soft-tissue]
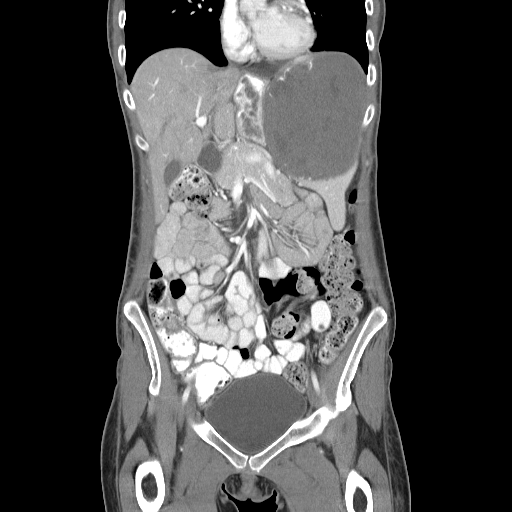
[im 31/70  soft-tissue]
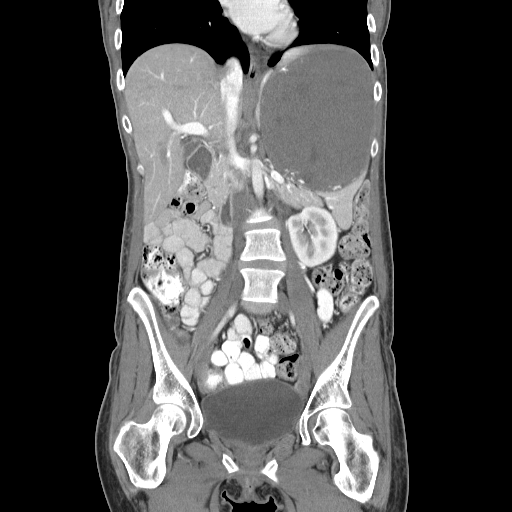
[im 39/70  soft-tissue]
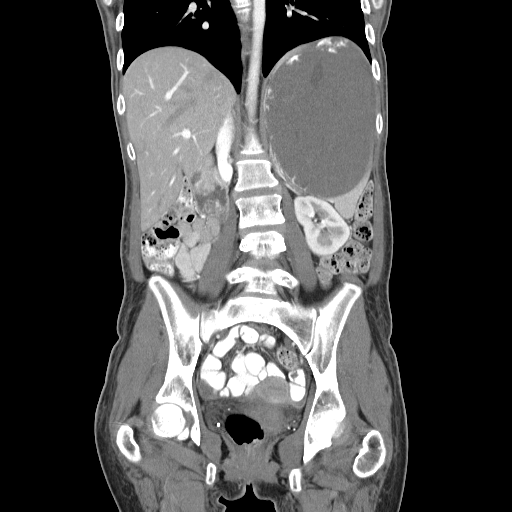

[17 of 46 positions shown; findings below may reference images not displayed]

FINDINGS: There is no pleural or pericardial effusion.

No focal liver abnormality. The gallbladder appears normal. There is
no biliary dilatation. Normal appearance of the pancreas. The spleen
measures 19.5 cm in length. This contains a large low-attenuation
mass with peripheral, nodular enhancement which measures 11.7 x
x 6.7 cm.

The adrenal glands are normal. Normal appearance of the kidneys. The
urinary bladder appears normal. The uterus and the adnexal
structures have a normal appearance for patient's age.

Normal caliber of the abdominal aorta. No aneurysm. There is no
retroperitoneal adenopathy identified. There is no pelvic or
inguinal adenopathy.

The stomach is displaced medially due to the large splenic mass. The
small bowel loops have a normal course and caliber. There is no
obstruction. Normal appearance of the colon.

Review of the visualized osseous structures is unremarkable.
IMPRESSION: 1. No acute findings within the abdomen or pelvis.
2. Large mass in the spleen is favored to represent a giant
hemangioma.

## 2016-04-18 IMAGING — CT CT HEAD W/O CM
2 series · 16 of 30 positions shown, 20 images · non-contrast
Comparison: None.

CLINICAL DATA: Altered mental status after recently stepping
psychiatric medications. Initial encounter.

EXAM:
CT HEAD WITHOUT CONTRAST
TECHNIQUE: Contiguous axial images were obtained from the base of the skull
through the vertex without intravenous contrast.

[Series 2: head w/o · axial · non-contrast · 0.45mm/px · z∈[-172,-52]mm · 13 of 30 slices shown, 17 images]
[im 3/30  brain]
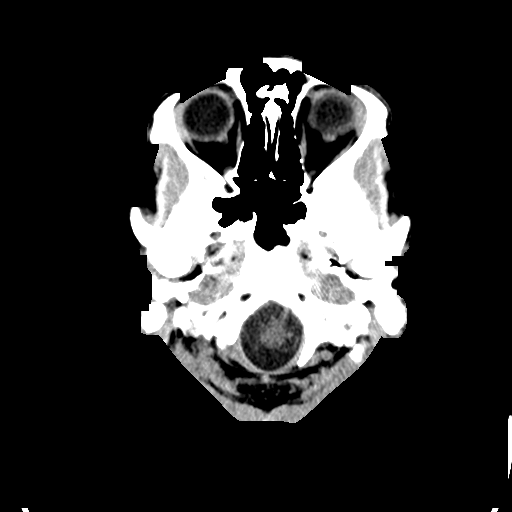
[im 3/30  bone]
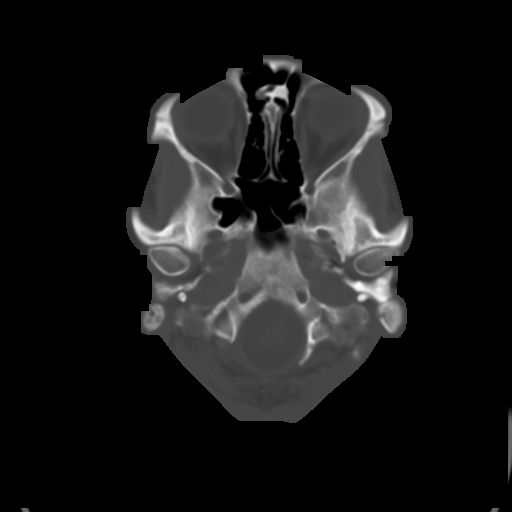
[im 5/30  brain]
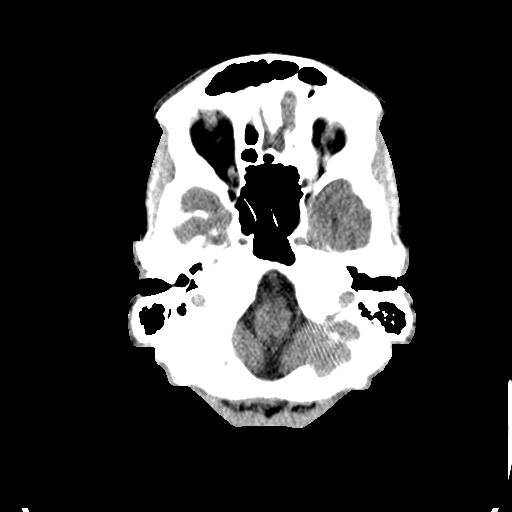
[im 7/30  brain]
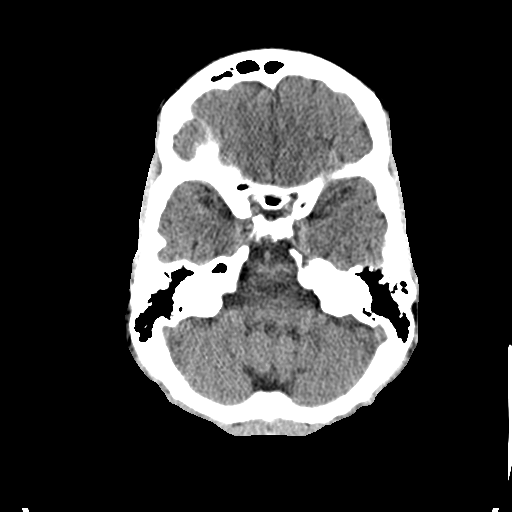
[im 9/30  brain]
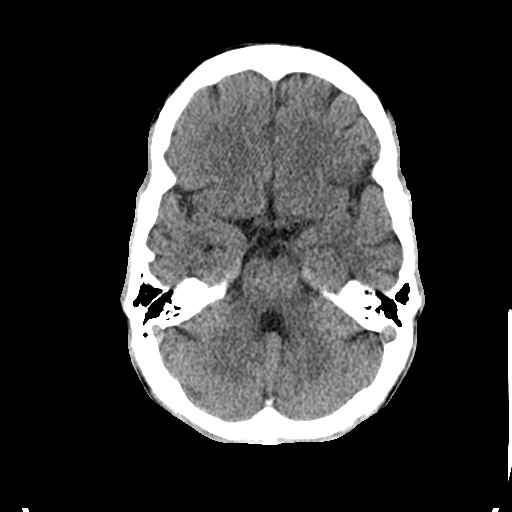
[im 11/30  brain]
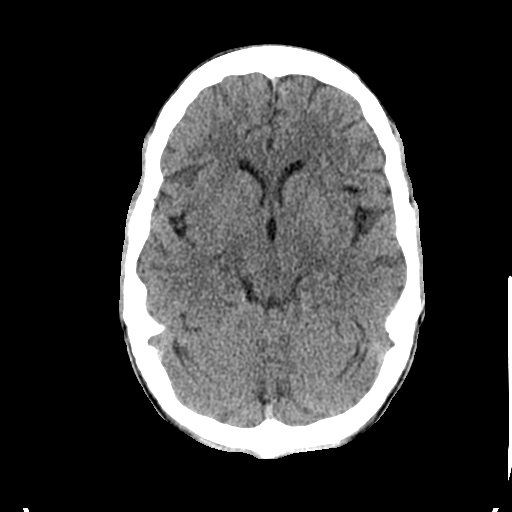
[im 11/30  bone]
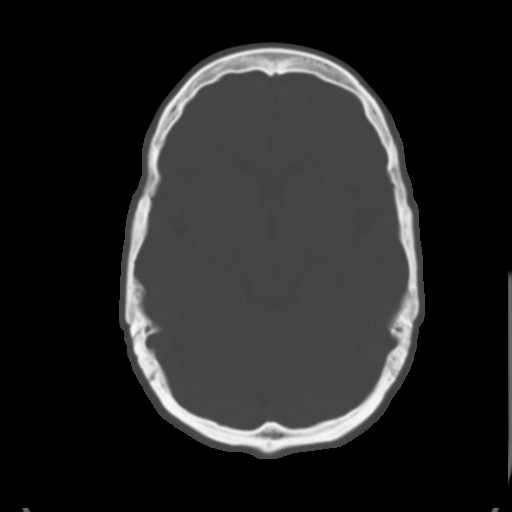
[im 13/30  brain]
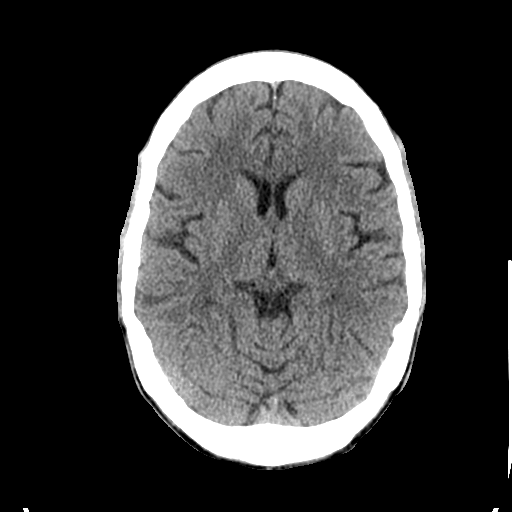
[im 15/30  brain]
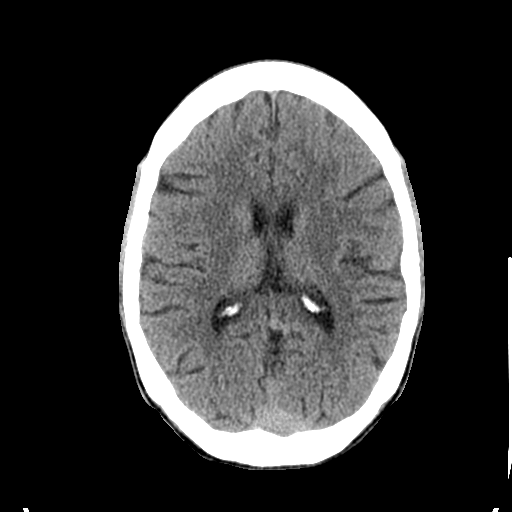
[im 17/30  brain]
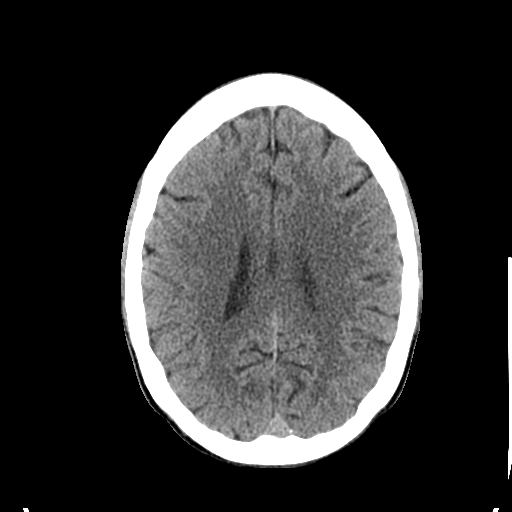
[im 19/30  brain]
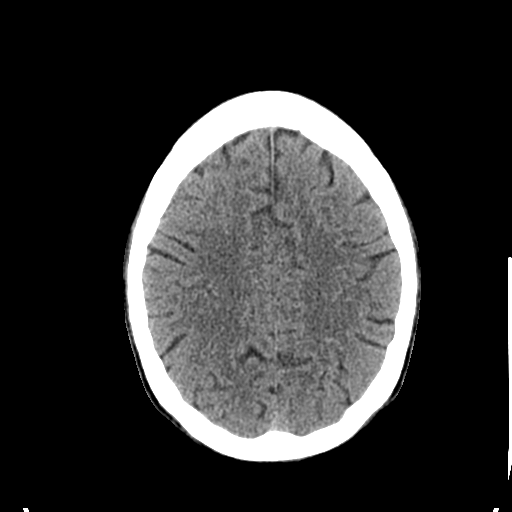
[im 19/30  bone]
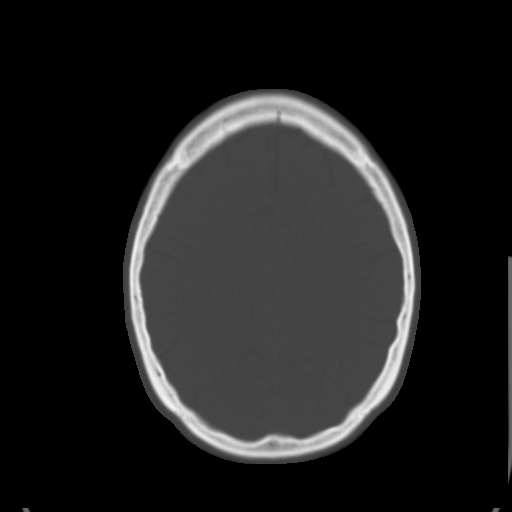
[im 21/30  brain]
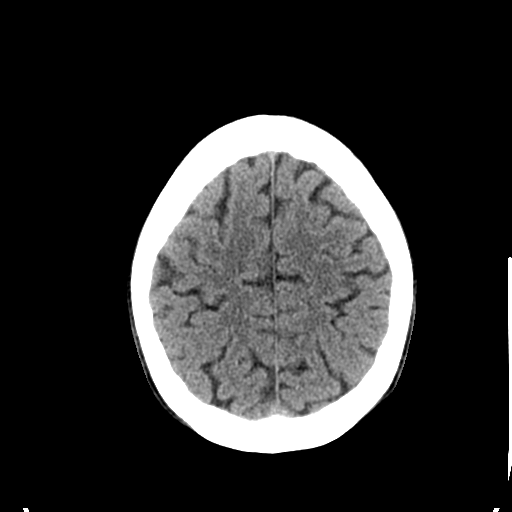
[im 23/30  brain]
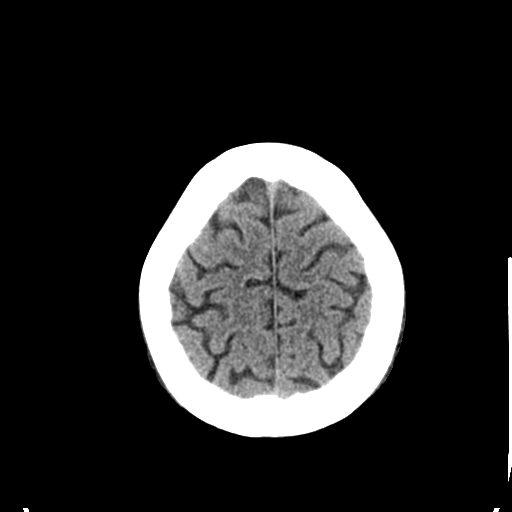
[im 25/30  brain]
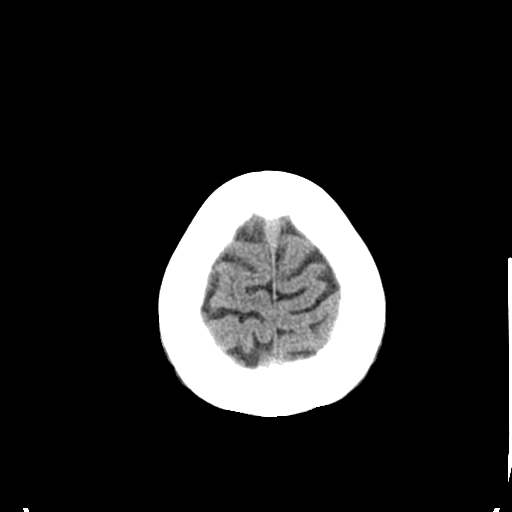
[im 27/30  brain]
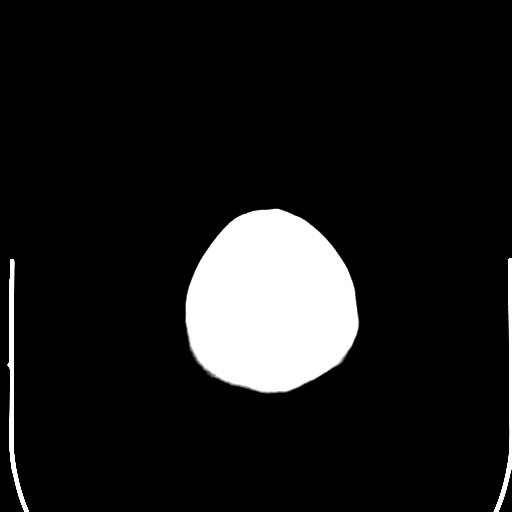
[im 27/30  bone]
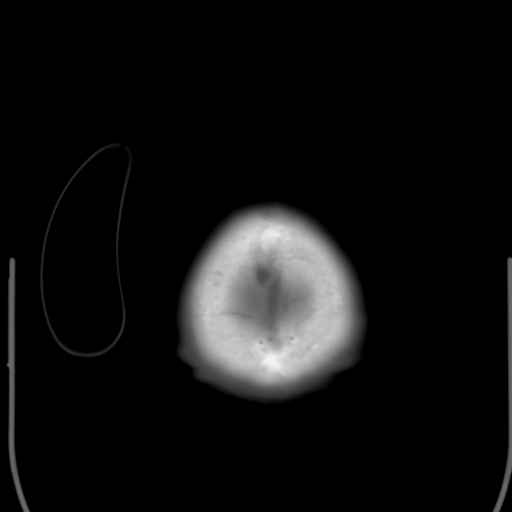

[Series 3: bone windows · axial · 0.45mm/px · z∈[-172,-132]mm · 3 of 30 slices shown]
[im 3/30  bone]
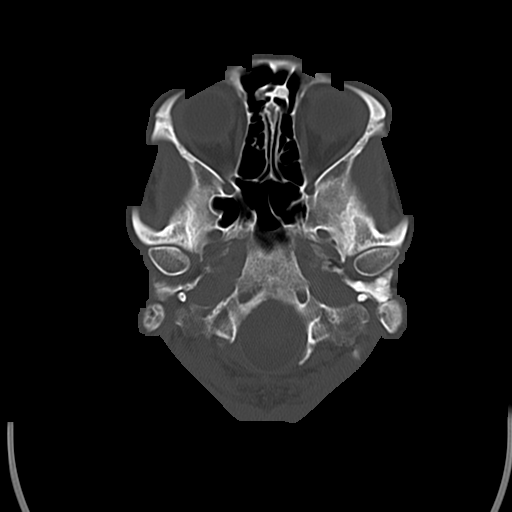
[im 7/30  bone]
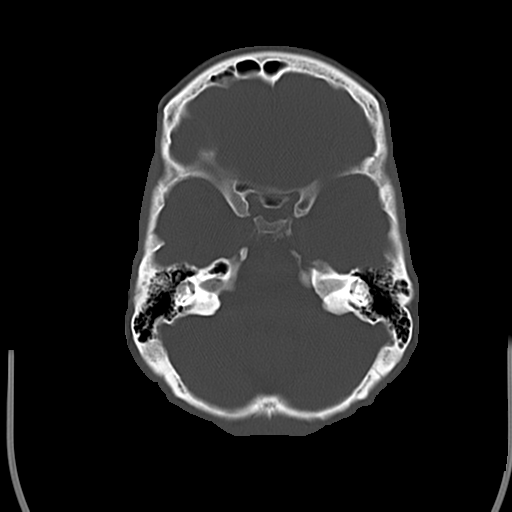
[im 11/30  bone]
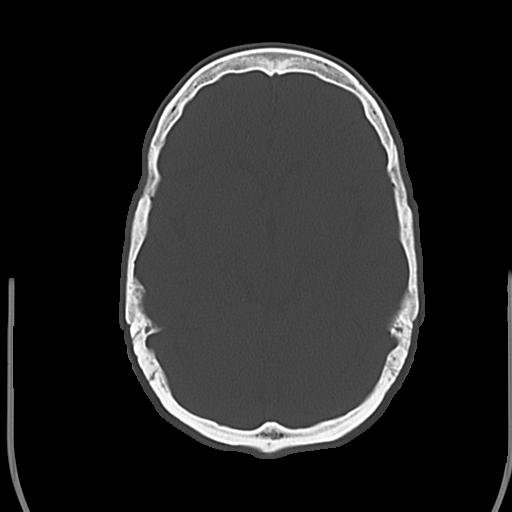

[16 of 30 positions shown; findings below may reference images not displayed]

FINDINGS: There is no evidence of acute intracranial hemorrhage, mass lesion,
brain edema or extra-axial fluid collection. The ventricles and
subarachnoid spaces are mildly prominent for age. There is no CT
evidence of acute cortical infarction.

The visualized paranasal sinuses, mastoid air cells and middle ears
are clear. The calvarium is intact.
IMPRESSION: Mild cerebral atrophy.  No acute intracranial findings.

## 2020-03-23 ENCOUNTER — Other Ambulatory Visit: Payer: Self-pay

## 2020-03-23 ENCOUNTER — Emergency Department (HOSPITAL_COMMUNITY): Payer: Medicaid Other

## 2020-03-23 ENCOUNTER — Emergency Department (HOSPITAL_COMMUNITY)
Admission: EM | Admit: 2020-03-23 | Discharge: 2020-03-23 | Disposition: A | Discharge status: Home / Self Care | Payer: Medicaid Other | Attending: Emergency Medicine | Admitting: Emergency Medicine

## 2020-03-23 ENCOUNTER — Encounter (HOSPITAL_COMMUNITY): Payer: Self-pay

## 2020-03-23 DIAGNOSIS — R059 Cough, unspecified: Secondary | ICD-10-CM

## 2020-03-23 DIAGNOSIS — Z20822 Contact with and (suspected) exposure to covid-19: Secondary | ICD-10-CM | POA: Insufficient documentation

## 2020-03-23 DIAGNOSIS — R0609 Other forms of dyspnea: Secondary | ICD-10-CM

## 2020-03-23 DIAGNOSIS — Z91010 Allergy to peanuts: Secondary | ICD-10-CM | POA: Diagnosis not present

## 2020-03-23 DIAGNOSIS — R0602 Shortness of breath: Secondary | ICD-10-CM | POA: Insufficient documentation

## 2020-03-23 DIAGNOSIS — F17210 Nicotine dependence, cigarettes, uncomplicated: Secondary | ICD-10-CM | POA: Diagnosis not present

## 2020-03-23 DIAGNOSIS — R0600 Dyspnea, unspecified: Secondary | ICD-10-CM

## 2020-03-23 LAB — COMPREHENSIVE METABOLIC PANEL
ALT: 21 U/L (ref 0–44)
AST: 24 U/L (ref 15–41)
Albumin: 4.3 g/dL (ref 3.5–5.0)
Alkaline Phosphatase: 62 U/L (ref 38–126)
Anion gap: 12 (ref 5–15)
BUN: 11 mg/dL (ref 6–20)
CO2: 22 mmol/L (ref 22–32)
Calcium: 9.9 mg/dL (ref 8.9–10.3)
Chloride: 105 mmol/L (ref 98–111)
Creatinine, Ser: 0.61 mg/dL (ref 0.44–1.00)
GFR, Estimated: >60 mL/min (ref >60)
Glucose, Bld: 106 mg/dL — ABNORMAL HIGH (ref 70–99)
Potassium: 4.1 mmol/L (ref 3.5–5.1)
Sodium: 139 mmol/L (ref 135–145)
Total Bilirubin: 0.8 mg/dL (ref 0.3–1.2)
Total Protein: 8.3 g/dL — ABNORMAL HIGH (ref 6.5–8.1)

## 2020-03-23 LAB — CBC WITH DIFFERENTIAL/PLATELET
Abs Immature Granulocytes: 0.04 10*3/uL (ref 0.00–0.07)
Basophils Absolute: 0.1 10*3/uL (ref 0.0–0.1)
Basophils Relative: 1 %
Eosinophils Absolute: 0.5 10*3/uL (ref 0.0–0.5)
Eosinophils Relative: 4 %
HCT: 42.7 % (ref 36.0–46.0)
Hemoglobin: 14 g/dL (ref 12.0–15.0)
Immature Granulocytes: 0 %
Lymphocytes Relative: 34 %
Lymphs Abs: 4.7 10*3/uL — ABNORMAL HIGH (ref 0.7–4.0)
MCH: 30.1 pg (ref 26.0–34.0)
MCHC: 32.8 g/dL (ref 30.0–36.0)
MCV: 91.8 fL (ref 80.0–100.0)
Monocytes Absolute: 0.8 10*3/uL (ref 0.1–1.0)
Monocytes Relative: 6 %
Neutro Abs: 7.6 10*3/uL (ref 1.7–7.7)
Neutrophils Relative %: 55 %
Platelets: 531 10*3/uL — ABNORMAL HIGH (ref 150–400)
RBC: 4.65 MIL/uL (ref 3.87–5.11)
RDW: 16.3 % — ABNORMAL HIGH (ref 11.5–15.5)
WBC: 13.8 10*3/uL — ABNORMAL HIGH (ref 4.0–10.5)
nRBC: 0 % (ref 0.0–0.2)

## 2020-03-23 LAB — RESP PANEL BY RT-PCR (FLU A&B, COVID) ARPGX2
Influenza A by PCR: NEGATIVE
Influenza B by PCR: NEGATIVE
SARS Coronavirus 2 by RT PCR: NEGATIVE

## 2020-03-23 LAB — D-DIMER, QUANTITATIVE: D-Dimer, Quant: <0.27 ug/mL-FEU (ref 0.00–0.50)

## 2020-03-23 MED ORDER — PREDNISONE 20 MG PO TABS: 40.0000 mg | ORAL_TABLET | Freq: Every day | ORAL | 0 refills | Status: AC

## 2020-03-23 MED ORDER — ALBUTEROL SULFATE HFA 108 (90 BASE) MCG/ACT IN AERS
2.0000 | INHALATION_SPRAY | RESPIRATORY_TRACT | Status: DC | PRN
  Administered 2020-03-23: 2 via RESPIRATORY_TRACT
  Filled 2020-03-23: qty 6.7

## 2020-03-23 MED ORDER — AEROCHAMBER PLUS FLO-VU LARGE MISC
Status: AC
  Administered 2020-03-23: 1
  Filled 2020-03-23: qty 1

## 2020-03-23 NOTE — ED Triage Notes (Signed)
Patient presents with [facility] from home, reports she has had a cough since November but decided to come in tonight to be seen. Report she thinks it may be PNA

## 2020-03-23 NOTE — ED Provider Notes (Signed)
Patient is a 35 year old female that I assumed care of at 3 PM from Dr. Tomi Bamberger.  She is here for shortness of breath that been ongoing since November.  She reports that she initially had gotten a cold but the shortness of breath never went away.  She has continued to have cough but denies any fever.  She has not seen anybody about this until today.  Her COVID test is negative.  She overall is well-appearing.  She does report that her symptoms seem to improved after using an inhaler but has no prior history of asthma has not had significant postnasal drip and does not get routine allergies.  She does not smoke cigarettes.  Her labs including CBC, CMP and D-dimer are all normal except for a mild leukocytosis of 13,000.  Ambulated patient with pulse ox without evidence of desaturation.  Will discharge patient home to use inhaler as needed.  Was also given a 5-day burst of steroids to see if that helped.  Encouraged her to follow-up with her PCP as she may need further lung testing to evaluate if she has asthma.   Blanchie Dessert, MD 03/23/20 [phone]

## 2020-03-23 NOTE — ED Provider Notes (Signed)
[name] EMERGENCY DEPARTMENT Provider Note   CSN: [id number] Arrival date & time: 03/23/20  0048     History Chief Complaint  Patient presents with  . Shortness of Breath  . Cough    Mary Cooley is a 35 y.o. female.  HPI   Patient presented to the ED for evaluation of cough and shortness of breath.  Patient states her symptoms started in November.  She has been coughing but denies any trouble with fever.  She has not noticed any leg swelling or chest pain.  Patient tried to see a doctor but was told she had to have a COVID test first.  That is why she came to the ED.  Past Medical History:  Diagnosis Date  . Anemia   . Anxiety   . Asperger's disorder   . Balance problem    due to blocked ear tubes, trouble hearing at times  . Bruising 09/29/2013  . Depression   . Headache   . Other pancytopenia ([place]) 09/29/2013  . Paranoia ([place])   . Parasite infection 5 years ago   pinworms  . PTSD (post-traumatic stress disorder)   . Schizophrenia ([place])   . Seizures ([place]) 1990's   once due to video game use, no seizures  since then  . Shortness of breath dyspnea    all the time   . Skin abrasion saw by dr Dina Rich pa 03-11-2014   left lower leg abrasion with hard area below/behind  knee, pt states area hurts to touch, was hit by shopping cart in leg  . Splenomegaly 10/28/2013  . Weight loss 09/29/2013    Patient Active Problem List   Diagnosis Date Noted  . Leukopenia 12/08/2014  . Preventive measure 12/08/2014  . Delusional disorder ([place]) 10/26/2014  . Severe malnutrition ([place]) 10/18/2014  . Hyperprolactinemia ([place]) 10/14/2014  . GAD (generalized anxiety disorder) 10/13/2014  . Panic disorder 10/13/2014  . 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 Benzodiazepine dependence ([place]) 10/13/2014  . Asperger's disorder 10/13/2014  . Diarrhea 05/05/2014  . Mass of left lower leg 05/05/2014  . Anemia in chronic illness 04/08/2014  .  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 Splenic mass s/p lap splenectomy 03/31/2014 03/31/2014  . Coagulopathy ([place]) 10/04/2013  . Other pancytopenia ([place]) 09/29/2013  . Bruising 09/29/2013  . Weight loss 09/29/2013    Past Surgical History:  Procedure Laterality Date  . DILATION AND CURETTAGE OF UTERUS  6-7 years agi  . LAPAROSCOPIC SPLENECTOMY N/A 03/31/2014   Procedure: LAPAROSCOPIC SPLENECTOMY;  Surgeon: Michael Boston, MD;  Location: [facility] ORS;  Service: General;  Laterality: N/A;  . NO PAST SURGERIES       OB History   No obstetric history on file.     Family History  Problem Relation Age of Onset  . Heart disease Father     Social History   Tobacco Use  . Smoking status: Current Some Day Smoker    Packs/day: 0.01    Years: 15.00    Pack years: 0.15    Types: Cigarettes  . Smokeless tobacco: Never Used  Substance Use Topics  . Alcohol use: No  . Drug use: No    Home Medications Prior to Admission medications   Medication Sig Start Date End Date Taking? Authorizing Provider  benztropine (COGENTIN) 0.5 MG tablet Take 1 tablet (0.5 mg total) by mouth at bedtime. 10/26/14   Kerrie Buffalo, NP  cyanocobalamin 500 MCG tablet Take 1  tablet (500 mcg total) by mouth daily. 10/26/14   Adonis Brook, NP  hydrOXYzine (ATARAX/VISTARIL) 25 MG tablet Take 1 tablet (25 mg total) by mouth every 4 (four) hours as needed for anxiety. 10/26/14   Adonis Brook, NP  mirtazapine (REMERON) 7.5 MG tablet Take 1 tablet (7.5 mg total) by mouth at bedtime. 10/26/14   Adonis Brook, NP  nicotine (NICODERM CQ - DOSED IN MG/24 HOURS) 14 mg/24hr patch Place 1 patch (14 mg total) onto the skin daily. 10/26/14   Adonis Brook, NP  OLANZapine (ZYPREXA) 5 MG tablet Take 1 tablet (5 mg total) by mouth daily. Patient not taking: Reported on 12/08/2014 10/26/14   Adonis Brook, NP  OLANZapine (ZYPREXA) 7.5 MG tablet Take 1 tablet (7.5 mg total) by mouth  at bedtime. Patient not taking: Reported on 12/08/2014 10/26/14   Adonis Brook, NP  sertraline (ZOLOFT) 25 MG tablet Take 5 tablets (125 mg total) by mouth daily. 10/26/14   Adonis Brook, NP    Allergies    Nsaids, Ceclor [cefaclor], Peanut-containing drug products, and Penicillins  Review of Systems   Review of Systems  All other systems reviewed and are negative.   Physical Exam Updated Vital Signs BP 119/86   Pulse (!) 126   Temp 97.9 F (36.6 C) (Oral)   Resp 16   Ht 1.727 m (5\' 8" )   Wt 102.1 kg   SpO2 92%   BMI 34.21 kg/m   Physical Exam Vitals and nursing note reviewed.  Constitutional:      General: She is not in acute distress.    Appearance: She is well-developed and well-nourished.  HENT:     Head: Normocephalic and atraumatic.     Right Ear: External ear normal.     Left Ear: External ear normal.  Eyes:     General: No scleral icterus.       Right eye: No discharge.        Left eye: No discharge.     Conjunctiva/sclera: Conjunctivae normal.  Neck:     Trachea: No tracheal deviation.  Cardiovascular:     Rate and Rhythm: Normal rate and regular rhythm.     Pulses: Intact distal pulses.  Pulmonary:     Effort: Pulmonary effort is normal. No respiratory distress.     Breath sounds: Normal breath sounds. No stridor. No wheezing or rales.  Abdominal:     General: Bowel sounds are normal. There is no distension.     Palpations: Abdomen is soft.     Tenderness: There is no abdominal tenderness. There is no guarding or rebound.  Musculoskeletal:        General: No tenderness or edema.     Cervical back: Neck supple.  Skin:    General: Skin is warm and dry.     Findings: No rash.  Neurological:     Mental Status: She is alert.     Cranial Nerves: No cranial nerve deficit (no facial droop, extraocular movements intact, no slurred speech).     Sensory: No sensory deficit.     Motor: No abnormal muscle tone or seizure activity.     Coordination:  Coordination normal.     Deep Tendon Reflexes: Strength normal.  Psychiatric:        Mood and Affect: Mood and affect normal.     ED Results / Procedures / Treatments   Labs (all labs ordered are listed, but only abnormal results are displayed) Labs Reviewed  RESP PANEL BY RT-PCR (FLU  A&B, COVID) ARPGX2  CBC WITH DIFFERENTIAL/PLATELET  COMPREHENSIVE METABOLIC PANEL  D-DIMER, QUANTITATIVE (NOT AT [facility])    EKG None  Radiology DG Chest 2 View  Result Date: 03/23/2020 CLINICAL DATA:  Shortness of breath EXAM: CHEST - 2 VIEW COMPARISON:  None. FINDINGS: No consolidation, features of edema, pneumothorax, or effusion. Pulmonary vascularity is normally distributed. The cardiomediastinal contours are unremarkable. No acute osseous or soft tissue abnormality. IMPRESSION: No acute cardiopulmonary abnormality. Electronically Signed   By: Lovena Le M.D.   On: 03/23/2020 02:11    Procedures Procedures (including critical care time)  Medications Ordered in ED Medications  albuterol (VENTOLIN HFA) 108 (90 Base) MCG/ACT inhaler 2 puff (2 puffs Inhalation Given 03/23/20 1533)  AeroChamber Plus Flo-Vu Large MISC (1 each  Given 03/23/20 1534)    ED Course  I have reviewed the triage vital signs and the nursing notes.  Pertinent labs & imaging results that were available during my care of the patient were reviewed by me and considered in my medical decision making (see chart for details).  Clinical Course as of 03/23/20 1542  Thu Mar 23, 2020  1423 Chest x-ray negative. [JK]    Clinical Course User Index [JK] Dorie Rank, MD   MDM Rules/Calculators/A&P                          Patient presents ED with complaints of cough and shortness of breath.  On my exam the patient is breathing easily.  No wheezing.  No swelling noted.  Chest x-ray does not show any acute abnormalities.  Her COVID test is negative.  Will check CBC and metabolic panel.  Also because of the tachycardia we will check a  D-dimer.  Care turned over to Dr. Maryan Rued Final Clinical Impression(s) / ED Diagnoses pending   Dorie Rank, MD 03/23/20 1544

## 2020-03-23 NOTE — ED Notes (Signed)
Called x 3 for treatment room, no answer

## 2020-03-23 NOTE — ED Notes (Signed)
Pt ambulated to restroom. 

## 2020-03-23 NOTE — Discharge Instructions (Addendum)
The test today showed no sign of blood clots, normal kidneys and x-ray showed that there was no sign of pneumonia.  You can use the inhalers every 6 hours as needed if you feel tight or short of breath.  Also start the steroids which she will take for the next 5 days to see if that helps with your symptoms as well.  If your symptoms become abruptly worse, you are unable to catch her breath or you have any severe chest pain or passing out return to the emergency room.

## 2020-03-23 NOTE — ED Notes (Signed)
Pt is here

## 2020-03-23 NOTE — ED Notes (Signed)
Pt maintained 96% SPO2 while ambulating.

## 2020-03-23 NOTE — ED Triage Notes (Signed)
Patient placed on 2L [place].

## 2020-03-23 NOTE — ED Notes (Signed)
Patient verbalizes understanding of discharge instructions. Opportunity for questioning and answers were provided. Armband removed by staff, pt discharged from ED.  

## 2022-11-14 ENCOUNTER — Encounter: Payer: Self-pay | Admitting: Family Medicine

## 2022-11-14 ENCOUNTER — Other Ambulatory Visit: Payer: Self-pay | Admitting: Family Medicine

## 2022-11-14 DIAGNOSIS — Z1231 Encounter for screening mammogram for malignant neoplasm of breast: Secondary | ICD-10-CM
